# Patient Record
Sex: Female | Born: 1990 | Race: White | Hispanic: No | Marital: Single | Smoking: Never smoker
Health system: Southern US, Community
[De-identification: ages and names within clinical notes are randomized; demographics above are authoritative.]

## PROBLEM LIST (undated history)

## (undated) DIAGNOSIS — F410 Panic disorder [episodic paroxysmal anxiety] without agoraphobia: Secondary | ICD-10-CM

## (undated) DIAGNOSIS — F32A Depression, unspecified: Secondary | ICD-10-CM

## (undated) DIAGNOSIS — M8618 Other acute osteomyelitis, other site: Secondary | ICD-10-CM

## (undated) DIAGNOSIS — F329 Major depressive disorder, single episode, unspecified: Secondary | ICD-10-CM

## (undated) DIAGNOSIS — F419 Anxiety disorder, unspecified: Secondary | ICD-10-CM

## (undated) DIAGNOSIS — J90 Pleural effusion, not elsewhere classified: Secondary | ICD-10-CM

## (undated) HISTORY — DX: Major depressive disorder, single episode, unspecified: F32.9

## (undated) HISTORY — PX: OTHER SURGICAL HISTORY: SHX169

## (undated) HISTORY — DX: Depression, unspecified: F32.A

## (undated) HISTORY — PX: CHEST SURGERY: SHX595

## (undated) HISTORY — DX: Anxiety disorder, unspecified: F41.9

## (undated) HISTORY — DX: Other acute osteomyelitis, other site: M86.18

---
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
## 2010-02-28 ENCOUNTER — Encounter: Payer: Self-pay | Admitting: Infectious Diseases

## 2010-09-03 ENCOUNTER — Encounter: Payer: Self-pay | Admitting: Family Medicine

## 2010-09-12 NOTE — Miscellaneous (Signed)
Summary: [facility]  [facility]   Imported By: Florinda Marker 03/01/2010 11:20:37  _____________________________________________________________________  External Attachment:    Type:   Image     Comment:   External Document

## 2011-11-08 ENCOUNTER — Emergency Department: Payer: Self-pay | Admitting: Emergency Medicine

## 2011-11-08 LAB — COMPREHENSIVE METABOLIC PANEL
Anion Gap: 15 (ref 7–16)
BUN: 12 mg/dL (ref 7–18)
Bilirubin,Total: 0.4 mg/dL (ref 0.2–1.0)
Calcium, Total: 9 mg/dL (ref 8.5–10.1)
Co2: 21 mmol/L (ref 21–32)
Osmolality: 280 (ref 275–301)
Potassium: 3 mmol/L — ABNORMAL LOW (ref 3.5–5.1)
Sodium: 140 mmol/L (ref 136–145)

## 2011-11-08 LAB — DRUG SCREEN, URINE
Amphetamines, Ur Screen: NEGATIVE (ref ?–1000)
Cannabinoid 50 Ng, Ur ~~LOC~~: NEGATIVE (ref ?–50)
Cocaine Metabolite,Ur ~~LOC~~: NEGATIVE (ref ?–300)
MDMA (Ecstasy)Ur Screen: NEGATIVE (ref ?–500)
Phencyclidine (PCP) Ur S: NEGATIVE (ref ?–25)

## 2011-11-08 LAB — URINALYSIS, COMPLETE
Glucose,UR: NEGATIVE mg/dL (ref 0–75)
RBC,UR: 1 /HPF (ref 0–5)

## 2011-11-08 LAB — ETHANOL: Ethanol %: <0.003 % (ref 0.000–0.080)

## 2011-11-08 LAB — CBC
MCV: 87 fL (ref 80–100)
RDW: 13.9 % (ref 11.5–14.5)

## 2011-11-08 LAB — TSH: Thyroid Stimulating Horm: 3.11 u[IU]/mL

## 2011-11-08 LAB — ACETAMINOPHEN LEVEL: Acetaminophen: <2 ug/mL

## 2012-07-23 ENCOUNTER — Ambulatory Visit: Payer: Self-pay | Admitting: Cardiovascular Disease

## 2012-08-12 ENCOUNTER — Institutional Professional Consult (permissible substitution): Payer: Self-pay | Admitting: Cardiology

## 2012-12-29 ENCOUNTER — Encounter: Payer: Self-pay | Admitting: Gynecology

## 2012-12-29 ENCOUNTER — Ambulatory Visit (INDEPENDENT_AMBULATORY_CARE_PROVIDER_SITE_OTHER): Payer: Self-pay | Admitting: Gynecology

## 2012-12-29 DIAGNOSIS — Z124 Encounter for screening for malignant neoplasm of cervix: Secondary | ICD-10-CM

## 2012-12-29 DIAGNOSIS — N906 Unspecified hypertrophy of vulva: Secondary | ICD-10-CM

## 2012-12-29 DIAGNOSIS — N946 Dysmenorrhea, unspecified: Secondary | ICD-10-CM

## 2012-12-29 DIAGNOSIS — Z01419 Encounter for gynecological examination (general) (routine) without abnormal findings: Secondary | ICD-10-CM

## 2012-12-29 DIAGNOSIS — R5381 Other malaise: Secondary | ICD-10-CM

## 2012-12-29 MED ORDER — DROSPIRENONE-ETHINYL ESTRADIOL 3-0.02 MG PO TABS: 1.0000 | ORAL_TABLET | Freq: Every day | ORAL | Status: DC

## 2012-12-29 NOTE — Progress Notes (Signed)
22 y.o.   Single    Caucasian   Female G0  here for annual exam.  Pt currently on oc for management of dysmenorrhea.  Pt used condoms when sexually active. Last partner 52m ago.  Pt has been screened for STD in past.  Pt reports enlarged minora labia that are uncomfortable with coitus, exercise and itchy.  Pt reports using otc monistat even if no discharge. She is interested in having a labioplasty done. 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  Patient's last menstrual period was 12/12/2012.          Sexually active: no  The current method of family planning is OCP (estrogen/progesterone).    Exercising: pilates 1-2x/wk Last mammogram:  none Last pap smear: 14 months ago- Normal History of abnormal pap: had abnormal papsmear 3 1/2 years ago had repeat papsmear normal. No procedure done. Smoking: no Alcohol: 5 drinks/month Last colonoscopy: none Last Bone Density:  none Last tetanus shot: 2006 Last cholesterol check:   Hgb: declined ( due to insurance)   Urine: declined   No family history on file.  Patient Active Problem List   Diagnosis Date Noted  . OSTEOMYELITIS, ACUTE, OTHER [facility] SITE 10/24/2006    Past Medical History  Diagnosis Date  . Acute osteomyelitis, other specified site     No past surgical history on file.  Allergies: Review of patient's allergies indicates no known allergies.  No current outpatient prescriptions on file.   No current facility-administered medications for this visit.    ROS: Pertinent items are noted in HPI.  Social Hx:    Exam:    LMP 12/12/2012   Wt Readings from Last 3 Encounters:  10/24/06 130 lb 9.6 oz (59.24 kg) (63%*, Z = 0.33)   * Growth percentiles are based on CDC 2-20 Years data.     Ht Readings from Last 3 Encounters:  No data found for Ht    General appearance: alert, cooperative and appears stated age Head: Normocephalic, without obvious abnormality, atraumatic Neck: no adenopathy, supple, symmetrical,  trachea midline and thyroid not enlarged, symmetric, no tenderness/mass/nodules Lungs: clear to auscultation bilaterally Breasts: Inspection negative, No nipple retraction or dimpling, No nipple discharge or bleeding, No axillary or supraclavicular adenopathy, Normal to palpation without dominant masses Heart: regular rate and rhythm Abdomen: soft, non-tender; bowel sounds normal; no masses,  no organomegaly Extremities: extremities normal, atraumatic, no cyanosis or edema Skin: Skin color, texture, turgor normal. No rashes or lesions Lymph nodes: Cervical, supraclavicular, and axillary nodes normal. No abnormal inguinal nodes palpated Neurologic: Grossly normal   Pelvic: External genitalia:  Enlarged labia minora protruding beyond majora, fused half way to majora creating pouches bilaterally, upper portion extend approx 4cm beyond majora              Urethra:  normal appearing urethra with no masses, tenderness or lesions              Bartholins and Skenes: normal                 Vagina: normal appearing vagina with normal color and discharge, no lesions              Cervix: normal appearance              Pap taken: yes        Bimanual Exam:  Uterus:  uterus is normal size, shape, consistency and nontender  Adnexa: normal adnexa in size, nontender and no masses                                      Rectovaginal: Confirms                                      Anus:  normal sphincter tone, no lesions  A:  Normal well women exam Symptomatic labial hypertrophy      P: pap smear with reflex Refill oc, declines STD screening We reviewed her labial hypertrophy at length, minor extend significantly beyond majora and are adherent to major posteriorly creating pockets for smegma. 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

## 2012-12-29 NOTE — Progress Notes (Deleted)
22 y.o.   Single    {Race/ethnicity:17218}   female   No obstetric history on file.   here for annual exam.    Patient's last menstrual period was 12/12/2012.          Sexually active: {yes no:314532}  The current method of family planning is {contraception:315051}.    Exercising:  Last mammogram:   Last pap smear: History of abnormal pap:  Smoking: Alcohol: Last colonoscopy: Last Bone Density:   Last tetanus shot: Last cholesterol check:   Hgb:                Urine:   No family history on file.  Patient Active Problem List   Diagnosis Date Noted  . OSTEOMYELITIS, ACUTE, OTHER [facility] SITE 10/24/2006    Past Medical History  Diagnosis Date  . Acute osteomyelitis, other specified site     No past surgical history on file.  Allergies: Review of patient's allergies indicates no known allergies.  No current outpatient prescriptions on file.   No current facility-administered medications for this visit.    ROS: Pertinent items are noted in HPI.  Social Hx:    Exam:    LMP 12/12/2012   Wt Readings from Last 3 Encounters:  10/24/06 130 lb 9.6 oz (59.24 kg) (63%*, Z = 0.33)   * Growth percentiles are based on CDC 2-20 Years data.     Ht Readings from Last 3 Encounters:  No data found for Ht    General appearance: alert, cooperative and appears stated age Head: Normocephalic, without obvious abnormality, atraumatic Neck: no adenopathy, supple, symmetrical, trachea midline and thyroid not enlarged, symmetric, no tenderness/mass/nodules Lungs: clear to auscultation bilaterally Breasts: Inspection negative, No nipple retraction or dimpling, No nipple discharge or bleeding, No axillary or supraclavicular adenopathy, Normal to palpation without dominant masses Heart: regular rate and rhythm Abdomen: soft, non-tender; bowel sounds normal; no masses,  no organomegaly Extremities: extremities normal, atraumatic, no cyanosis or edema Skin: Skin color, texture, turgor  normal. No rashes or lesions Lymph nodes: Cervical, supraclavicular, and axillary nodes normal. No abnormal inguinal nodes palpated Neurologic: Grossly normal   Pelvic: External genitalia:  no lesions              Urethra:  normal appearing urethra with no masses, tenderness or lesions              Bartholins and Skenes: normal                 Vagina: normal appearing vagina with normal color and discharge, no lesions              Cervix: normal appearance              Pap taken: yes        Bimanual Exam:  Uterus:  uterus is normal size, shape, consistency and nontender                                      Adnexa: normal adnexa in size, nontender and no masses                                      Rectovaginal: Confirms  Anus:  normal sphincter tone, no lesions  A: normal exam     P: {plan; gyn:5269::"mammogram","pap smear","return annually or prn"}     An After Visit Summary was printed and given to the patient.

## 2012-12-31 ENCOUNTER — Ambulatory Visit (INDEPENDENT_AMBULATORY_CARE_PROVIDER_SITE_OTHER): Payer: Self-pay | Admitting: *Deleted

## 2012-12-31 DIAGNOSIS — Z0000 Encounter for general adult medical examination without abnormal findings: Secondary | ICD-10-CM

## 2012-12-31 DIAGNOSIS — R5381 Other malaise: Secondary | ICD-10-CM

## 2012-12-31 NOTE — Progress Notes (Signed)
Patient and mother called this am for lab appointment stating they had talked with Dr Farrel Gobble regarding Thyroid labwork. Patient was just seen for AEX and was self pay so she declined all labs.  Did not fully explain to Dr lathrop the extent of her fatigue, nausea and dizziness.  Mother is insistent that "something is wrong with her" and wants all testing done and she will pay cost for patient .  Mother states patient would sleep ell the time if mother didn't get her up.  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  Patient is fasting at 220 pm since 2300 last night and is sitting in lab chair with head down on arm of chair. They are concerned that with her history of osteomylitis that perhaps something like that is going on.  Discussed the fact that we are GYN practice and that we can not do labwork without an order and that Dr lathrop needs to be able to assess her in order to know what labs should be done.  The mother requests we dram blood and hold it for Dr lathrop  To review tomorrow because patient works in [place] and wont be able to get back here. They are advised that if labwork shows any abnormality, we would have to refer her out. They are agreeable. They are aware Dr Farrel Gobble is not in office and that we are making an exception for her by drawing blood and holding for order that MD may not be willing to order.

## 2013-01-01 ENCOUNTER — Other Ambulatory Visit: Payer: Self-pay | Admitting: Gynecology

## 2013-01-01 ENCOUNTER — Other Ambulatory Visit: Payer: Self-pay | Admitting: *Deleted

## 2013-01-01 DIAGNOSIS — R5381 Other malaise: Secondary | ICD-10-CM

## 2013-01-01 NOTE — Addendum Note (Signed)
Addended by: Douglass Rivers on: 01/01/2013 05:20 PM   Modules accepted: Orders

## 2013-01-02 DIAGNOSIS — R5383 Other fatigue: Secondary | ICD-10-CM

## 2013-01-02 DIAGNOSIS — R5381 Other malaise: Secondary | ICD-10-CM

## 2013-01-02 LAB — CBC
MCHC: 34.2 g/dL (ref 30.0–36.0)
MCV: 88 fL (ref 78.0–100.0)
Platelets: 262 10*3/uL (ref 150–400)
RBC: 4.59 MIL/uL (ref 3.87–5.11)
RDW: 13.9 % (ref 11.5–15.5)

## 2013-01-02 LAB — COMPREHENSIVE METABOLIC PANEL
Albumin: 3.6 g/dL (ref 3.5–5.2)
CO2: 25 mEq/L (ref 19–32)
Chloride: 104 mEq/L (ref 96–112)
Glucose, Bld: 89 mg/dL (ref 70–99)
Potassium: 4.6 mEq/L (ref 3.5–5.3)
Total Protein: 6.6 g/dL (ref 6.0–8.3)

## 2013-01-02 MED ORDER — FLUCONAZOLE 150 MG PO TABS: 150.0000 mg | ORAL_TABLET | Freq: Once | ORAL | Status: DC

## 2013-01-02 NOTE — Telephone Encounter (Signed)
Per Dr. Farrel Gobble, pt had signs of yeast on her papsmear ok to call in to pharmacy, if pt desires. Diflucan 150 mg #1/0 rf's sent through to [facility].  LMTCB  Re: Rx being called in and labs CBC/TSH/VIT D, Epstein Bar Virus, CMV (Cytomegalovirus) Dr. Farrel Gobble added to her bloodwork.

## 2013-01-06 ENCOUNTER — Telehealth: Payer: Self-pay | Admitting: Gynecology

## 2013-01-06 NOTE — Telephone Encounter (Signed)
Pt calling for results

## 2013-01-06 NOTE — Telephone Encounter (Signed)
Pt. Notified of results (see lab) 

## 2013-01-07 LAB — EPSTEIN-BARR VIRUS NUCLEAR ANTIGEN ANTIBODY, IGG: EBV NA IgG: 3.4 U/mL (ref <18.0)

## 2013-01-08 NOTE — Telephone Encounter (Signed)
Pt. Called to confirm surgery. Pt. Called insurance and found out that her insurance is active February 10, 2013 . Insurance will cover all but. 30%. Please call to schedule surgery/rsb

## 2013-01-12 NOTE — Telephone Encounter (Signed)
Needs to be scheduled for pre-op and surgery the patient. Is paying out of pocket.

## 2013-01-13 ENCOUNTER — Telehealth: Payer: Self-pay | Admitting: Gynecology

## 2013-01-13 NOTE — Telephone Encounter (Signed)
Patient stated that she has been trying to reach [facility] for the last couple of weeks in regards to scheduling an appointment for a procedure with no success.

## 2013-01-13 NOTE — Telephone Encounter (Signed)
Patient stated that she was returning Kayla Livingston's phone call to leave her insurance ID number. Patient did not want to give the ID # to me; she stated that she was told by Eber Jones to leave a message for her if she was not available.

## 2013-01-14 NOTE — Telephone Encounter (Signed)
RETURNING A CALL TO CAROLYN

## 2013-01-14 NOTE — Telephone Encounter (Signed)
Patient says she has been waiting on a call from Kennon Rounds and Eber Jones?

## 2013-01-15 NOTE — Telephone Encounter (Signed)
Surgery is scheduled for 02-17-13 @0930   at [facility]. Patient is notified of date and requests to call back for scheduling pre/post op appointments.

## 2013-01-19 NOTE — Telephone Encounter (Signed)
See next telephone note for scheduling info. 

## 2013-01-21 ENCOUNTER — Telehealth: Payer: Self-pay | Admitting: Gynecology

## 2013-01-21 NOTE — Telephone Encounter (Signed)
Pt calling to give sally insurance information and to get date of pre-op appt.

## 2013-01-22 NOTE — Telephone Encounter (Signed)
I will mark her in pre-op b/c I will be gone, I think 1 w post-op is ok, can add another f/u if needed

## 2013-01-22 NOTE — Telephone Encounter (Signed)
Pt is very upset that Kennon Rounds has not called her back yet. Pt states she has called nine times in the last couple of days. She also states she will call every hour until she talks to Adams. She did not say exactly what she was calling about.

## 2013-01-22 NOTE — Telephone Encounter (Signed)
See next phone note.  Surgery info given.

## 2013-01-22 NOTE — Telephone Encounter (Signed)
I will mark pt in pre-op, 1 week post op is ok, can add 2nd visit if need be

## 2013-01-22 NOTE — Telephone Encounter (Signed)
Call to patient to advise of surgery date and time. Apologizes for delay in return call, our insurance dept was attempting to determine discount for self pay patient's versus ability to file claim after insurance effective date.  Patient now states she has been added to parents plan and has coverage.  Insurance numbers received from patient and will have Carolynn call her with new estimate.  Surgical instructions given and mailed. Scheduled 1 week post op.  Patient says you told her she needed preop appt the day before surgery, you are out of office 02-16-13, when to schedule?  Should I schedule more than 1 post op appt?

## 2013-01-29 ENCOUNTER — Telehealth: Payer: Self-pay | Admitting: Gynecology

## 2013-01-29 NOTE — Telephone Encounter (Signed)
6/19 lmtcb//kn 

## 2013-01-29 NOTE — Telephone Encounter (Signed)
LMTCB to discuss setting a payment date for her surgery on 02/17/13.

## 2013-02-03 ENCOUNTER — Telehealth: Payer: Self-pay | Admitting: Infectious Diseases

## 2013-02-03 NOTE — Telephone Encounter (Signed)
Spoke with patient about her upcoming surgery and the need for payment. Patient agreed to stop by on Friday 6/27 to make the full payment. Patient understands that if we do not receive her payment by Friday her surgery will be canceled. Patient was agreeable. Scheduled pre-op appointment for 7/7 with Dr. Farrel Gobble.

## 2013-02-03 NOTE — Telephone Encounter (Signed)
Dr Lathrop's schedule confirmed and preop for patient scheduled for 02-16-13 and patient spoke to Carolynn to confirm appt and payment plan.

## 2013-02-09 ENCOUNTER — Encounter (HOSPITAL_COMMUNITY): Payer: Self-pay

## 2013-02-09 NOTE — Telephone Encounter (Signed)
[facility] admissions calling for insurance coverage for this patient? None in EPIC? Please advise.

## 2013-02-16 ENCOUNTER — Ambulatory Visit (INDEPENDENT_AMBULATORY_CARE_PROVIDER_SITE_OTHER): Payer: BC Managed Care – PPO | Admitting: Gynecology

## 2013-02-16 VITALS — BP 118/74 | HR 78 | Resp 14 | Ht 68.0 in | Wt 147.0 lb

## 2013-02-16 DIAGNOSIS — N906 Unspecified hypertrophy of vulva: Secondary | ICD-10-CM

## 2013-02-16 MED ORDER — LIDOCAINE HCL 2 % EX GEL: CUTANEOUS | Status: DC | PRN

## 2013-02-16 MED ORDER — ESTRADIOL 0.1 MG/GM VA CREA: 2.0000 g | TOPICAL_CREAM | Freq: Every day | VAGINAL | Status: DC

## 2013-02-16 NOTE — Progress Notes (Signed)
Left message with office to advise Dr. Farrel Gobble there are no pre-op orders for this patient.

## 2013-02-16 NOTE — Patient Instructions (Signed)
Sit on a donut after surgery. Ice pack for the first 24h-frozen peas Apply estrogen cream to the surgical site 2-3x/day Initially urinate in sitz bath, cool to luke warm water, prn Lidocaine jelly as needed for itching Blow dry after sitz bath and shower-cool setting, blot not wipe Colace twice a day, starting today

## 2013-02-16 NOTE — Progress Notes (Signed)
Subjective:     Patient ID: Kayla Livingston, female   DOB: Apr 23, 1991, 22 y.o.   MRN: [id number]  HPI Comments: Pt here for pre-op for labioplasty.  Pt reports that labia interfere with genital hygiene and make coitus painful, as well as interfere with activates of daily living.      Review of Systems  Constitutional: Negative.   Respiratory: Negative.   Genitourinary: Negative.        Objective:   Physical Exam  Constitutional: She is oriented to person, place, and time. She appears well-developed and well-nourished.  Eyes: Conjunctivae are normal. Pupils are equal, round, and reactive to light.  Neck: Normal range of motion. Neck supple.  Cardiovascular: Normal rate, regular rhythm, normal heart sounds and intact distal pulses.   Pulmonary/Chest: Effort normal and breath sounds normal.  Abdominal: Soft. Bowel sounds are normal.  Genitourinary:     Neurological: She is alert and oriented to person, place, and time. She has normal reflexes.       Assessment:    labial hypertrophy    Plan:     For reduction in am Questions addressed. Risks of bleeding, infection, DVT formation, PE. Discussed, samples of estrace given and instructed

## 2013-02-17 ENCOUNTER — Ambulatory Visit (HOSPITAL_COMMUNITY)
Admission: RE | Admit: 2013-02-17 | Discharge: 2013-02-17 | Disposition: A | Discharge status: Home / Self Care | Payer: BC Managed Care – PPO | Source: Ambulatory Visit | Attending: Gynecology | Admitting: Gynecology

## 2013-02-17 ENCOUNTER — Ambulatory Visit (HOSPITAL_COMMUNITY): Payer: BC Managed Care – PPO | Admitting: Anesthesiology

## 2013-02-17 ENCOUNTER — Encounter (HOSPITAL_COMMUNITY): Payer: Self-pay | Admitting: Anesthesiology

## 2013-02-17 ENCOUNTER — Encounter (HOSPITAL_COMMUNITY): Payer: Self-pay | Admitting: *Deleted

## 2013-02-17 ENCOUNTER — Encounter (HOSPITAL_COMMUNITY)
Admission: RE | Disposition: A | Discharge status: Home / Self Care | Payer: Self-pay | Source: Ambulatory Visit | Attending: Gynecology

## 2013-02-17 DIAGNOSIS — N906 Unspecified hypertrophy of vulva: Secondary | ICD-10-CM | POA: Insufficient documentation

## 2013-02-17 HISTORY — DX: Panic disorder (episodic paroxysmal anxiety): F41.0

## 2013-02-17 HISTORY — PX: LABIOPLASTY: SHX1900

## 2013-02-17 LAB — CBC
HCT: 44.5 % (ref 36.0–46.0)
Platelets: 201 10*3/uL (ref 150–400)
RBC: 4.34 MIL/uL (ref 3.87–5.11)
RDW: 12.8 % (ref 11.5–15.5)
WBC: 4.5 10*3/uL (ref 4.0–10.5)

## 2013-02-17 SURGERY — LABIAPLASTY, VULVA
Anesthesia: General | Wound class: Clean Contaminated

## 2013-02-17 MED ORDER — BUPIVACAINE HCL (PF) 0.25 % IJ SOLN
INTRAMUSCULAR | Status: AC
  Filled 2013-02-17: qty 30

## 2013-02-17 MED ORDER — FENTANYL CITRATE 0.05 MG/ML IJ SOLN: 25.0000 ug | INTRAMUSCULAR | Status: DC | PRN

## 2013-02-17 MED ORDER — KETOROLAC TROMETHAMINE 30 MG/ML IJ SOLN
INTRAMUSCULAR | Status: AC
  Filled 2013-02-17: qty 1

## 2013-02-17 MED ORDER — DEXAMETHASONE SODIUM PHOSPHATE 10 MG/ML IJ SOLN
INTRAMUSCULAR | Status: AC
  Filled 2013-02-17: qty 1

## 2013-02-17 MED ORDER — KETOROLAC TROMETHAMINE 30 MG/ML IJ SOLN: 15.0000 mg | Freq: Once | INTRAMUSCULAR | Status: DC | PRN

## 2013-02-17 MED ORDER — ONDANSETRON HCL 4 MG/2ML IJ SOLN
INTRAMUSCULAR | Status: AC
  Filled 2013-02-17: qty 2

## 2013-02-17 MED ORDER — ACETAMINOPHEN 160 MG/5ML PO SOLN: 975.0000 mg | Freq: Once | ORAL | Status: AC

## 2013-02-17 MED ORDER — ESTRADIOL 0.1 MG/GM VA CREA
TOPICAL_CREAM | VAGINAL | Status: DC | PRN
  Administered 2013-02-17: 1 via VAGINAL

## 2013-02-17 MED ORDER — LACTATED RINGERS IV SOLN
INTRAVENOUS | Status: DC
  Administered 2013-02-17 (×2): via INTRAVENOUS

## 2013-02-17 MED ORDER — LIDOCAINE HCL 2 % IJ SOLN
INTRAMUSCULAR | Status: DC | PRN
  Administered 2013-02-17: 10 mL

## 2013-02-17 MED ORDER — FENTANYL CITRATE 0.05 MG/ML IJ SOLN
INTRAMUSCULAR | Status: DC | PRN
  Administered 2013-02-17 (×3): 50 ug via INTRAVENOUS

## 2013-02-17 MED ORDER — LIDOCAINE HCL (CARDIAC) 20 MG/ML IV SOLN
INTRAVENOUS | Status: DC | PRN
  Administered 2013-02-17 (×2): 30 mg via INTRAVENOUS

## 2013-02-17 MED ORDER — LACTATED RINGERS IV SOLN
INTRAVENOUS | Status: DC
  Administered 2013-02-17: 10:00:00 via INTRAVENOUS

## 2013-02-17 MED ORDER — OXYCODONE-ACETAMINOPHEN 5-325 MG PO TABS
1.0000 | ORAL_TABLET | Freq: Once | ORAL | Status: AC
  Administered 2013-02-17: 1 via ORAL

## 2013-02-17 MED ORDER — MIDAZOLAM HCL 5 MG/5ML IJ SOLN
INTRAMUSCULAR | Status: DC | PRN
  Administered 2013-02-17: 2 mg via INTRAVENOUS

## 2013-02-17 MED ORDER — BUPIVACAINE HCL (PF) 0.25 % IJ SOLN
INTRAMUSCULAR | Status: DC | PRN
  Administered 2013-02-17: 10 mL

## 2013-02-17 MED ORDER — ESTRADIOL 0.1 MG/GM VA CREA
TOPICAL_CREAM | VAGINAL | Status: AC
  Filled 2013-02-17: qty 42.5

## 2013-02-17 MED ORDER — 0.9 % SODIUM CHLORIDE (POUR BTL) OPTIME
TOPICAL | Status: DC | PRN
  Administered 2013-02-17: 1000 mL

## 2013-02-17 MED ORDER — FENTANYL CITRATE 0.05 MG/ML IJ SOLN
INTRAMUSCULAR | Status: AC
  Filled 2013-02-17: qty 2

## 2013-02-17 MED ORDER — ACETAMINOPHEN 160 MG/5ML PO SOLN
ORAL | Status: AC
  Administered 2013-02-17: 975 mg via ORAL
  Filled 2013-02-17: qty 40.6

## 2013-02-17 MED ORDER — KETOROLAC TROMETHAMINE 30 MG/ML IJ SOLN
INTRAMUSCULAR | Status: DC | PRN
  Administered 2013-02-17 (×2): 30 mg via INTRAVENOUS

## 2013-02-17 MED ORDER — DEXAMETHASONE SODIUM PHOSPHATE 10 MG/ML IJ SOLN
INTRAMUSCULAR | Status: DC | PRN
  Administered 2013-02-17: 10 mg via INTRAVENOUS

## 2013-02-17 MED ORDER — ONDANSETRON HCL 4 MG/2ML IJ SOLN
INTRAMUSCULAR | Status: DC | PRN
  Administered 2013-02-17: 4 mg via INTRAVENOUS

## 2013-02-17 MED ORDER — PROPOFOL 10 MG/ML IV EMUL
INTRAVENOUS | Status: AC
  Filled 2013-02-17: qty 20

## 2013-02-17 MED ORDER — LIDOCAINE HCL (CARDIAC) 20 MG/ML IV SOLN
INTRAVENOUS | Status: AC
  Filled 2013-02-17: qty 5

## 2013-02-17 MED ORDER — LIDOCAINE HCL 2 % IJ SOLN
INTRAMUSCULAR | Status: AC
  Filled 2013-02-17: qty 20

## 2013-02-17 MED ORDER — PROPOFOL 10 MG/ML IV BOLUS
INTRAVENOUS | Status: DC | PRN
  Administered 2013-02-17: 160 mg via INTRAVENOUS
  Administered 2013-02-17: 40 mg via INTRAVENOUS

## 2013-02-17 MED ORDER — MIDAZOLAM HCL 2 MG/2ML IJ SOLN
INTRAMUSCULAR | Status: AC
  Filled 2013-02-17: qty 2

## 2013-02-17 MED ORDER — OXYCODONE-ACETAMINOPHEN 5-325 MG PO TABS
ORAL_TABLET | ORAL | Status: AC
  Filled 2013-02-17: qty 1

## 2013-02-17 SURGICAL SUPPLY — 23 items
BLADE SURG 15 STRL LF C SS BP (BLADE) ×1 IMPLANT
BLADE SURG 15 STRL SS (BLADE) ×2
CONTAINER PREFILL 10% NBF 15ML (MISCELLANEOUS) ×2 IMPLANT
ELECT NDL TIP 2.8 STRL (NEEDLE) IMPLANT
ELECT NEEDLE TIP 2.8 STRL (NEEDLE) ×2 IMPLANT
GAUZE SPONGE 4X4 16PLY XRAY LF (GAUZE/BANDAGES/DRESSINGS) ×1 IMPLANT
GLOVE BIOGEL M 6.5 STRL (GLOVE) ×2 IMPLANT
GLOVE SURGEON 6.5 (GLOVE) ×2 IMPLANT
GOWN STRL REIN XL XLG (GOWN DISPOSABLE) ×4 IMPLANT
GOWN W/COTTON TOWEL STD LRG (GOWNS) ×4 IMPLANT
NDL HYPO 25X1 1.5 SAFETY (NEEDLE) ×1 IMPLANT
NEEDLE HYPO 25X1 1.5 SAFETY (NEEDLE) ×2 IMPLANT
PACK VAGINAL MINOR WOMEN LF (CUSTOM PROCEDURE TRAY) ×2 IMPLANT
PAD OB MATERNITY 4.3X12.25 (PERSONAL CARE ITEMS) ×2 IMPLANT
PENCIL BUTTON HOLSTER BLD 10FT (ELECTRODE) ×1 IMPLANT
SALINE 1000ML FOR IRRIGATION (IV SOLUTION) ×2 IMPLANT
SUT CHROMIC 3 0 SH 27 (SUTURE) IMPLANT
SUT PDS AB 4-0 PS2 18 (SUTURE) ×4 IMPLANT
SUT VIC AB 3-0 SH 27 (SUTURE)
SUT VIC AB 3-0 SH 27X BRD (SUTURE) IMPLANT
SUT VICRYL RAPIDE 3 0 (SUTURE) IMPLANT
TOWEL COTTON PACK 6EA (MISCELLANEOUS) ×4 IMPLANT
WATER 1000ML FOR IRRIGATION (IV SOLUTION) ×2 IMPLANT

## 2013-02-17 NOTE — Transfer of Care (Signed)
Immediate Anesthesia Transfer of Care Note  Patient: Kayla Livingston  Procedure(s) Performed: Procedure(s): LABIAPLASTY (N/A)  Patient Location: PACU  Anesthesia Type:General  Level of Consciousness: awake, alert  and oriented  Airway & Oxygen Therapy: Patient Spontanous Breathing and Patient connected to nasal cannula oxygen  Post-op Assessment: Report given to PACU RN and Post -op Vital signs reviewed and stable  Post vital signs: Reviewed and stable  Complications: No apparent anesthesia complications

## 2013-02-17 NOTE — H&P (View-Only) (Signed)
Subjective:     Patient ID: Kayla Livingston, female   DOB: 06/27/1991, 22 y.o.   MRN: [id number]  HPI Comments: Pt here for pre-op for labioplasty.  Pt reports that labia interfere with genital hygiene and make coitus painful, as well as interfere with activates of daily living.      Review of Systems  Constitutional: Negative.   Respiratory: Negative.   Genitourinary: Negative.        Objective:   Physical Exam  Constitutional: She is oriented to person, place, and time. She appears well-developed and well-nourished.  Eyes: Conjunctivae are normal. Pupils are equal, round, and reactive to light.  Neck: Normal range of motion. Neck supple.  Cardiovascular: Normal rate, regular rhythm, normal heart sounds and intact distal pulses.   Pulmonary/Chest: Effort normal and breath sounds normal.  Abdominal: Soft. Bowel sounds are normal.  Genitourinary:     Neurological: She is alert and oriented to person, place, and time. She has normal reflexes.       Assessment:    labial hypertrophy    Plan:     For reduction in am Questions addressed. Risks of bleeding, infection, DVT formation, PE. Discussed, samples of estrace given and instructed       

## 2013-02-17 NOTE — Brief Op Note (Signed)
02/17/2013  11:29 AM  PATIENT:  Kayla Livingston  22 y.o. female  PRE-OPERATIVE DIAGNOSIS:  Labial Hypertrophy  POST-OPERATIVE DIAGNOSIS:  Labial Hypertrophy  PROCEDURE:  Procedure(s): LABIAPLASTY (N/A)  SURGEON:  Surgeon(s) and Role:    * Bennye Alm, MD - Primary  PHYSICIAN ASSISTANT:   ASSISTANTS: none   ANESTHESIA:   local and general  EBL:  Total I/O In: 1300 [I.V.:1300] Out: 50 [Urine:20; Blood:30]  BLOOD ADMINISTERED:none  DRAINS: none   LOCAL MEDICATIONS USED:  MARCAINE   , LIDOCAINE  and Amount: 10 ml  SPECIMEN:  No Specimen  DISPOSITION OF SPECIMEN:  N/A  COUNTS:  YES  TOURNIQUET:  * No tourniquets in log *  DICTATION: .Other Dictation: Dictation Number [id number]  PLAN OF CARE: Discharge to home after PACU  PATIENT DISPOSITION:  PACU - hemodynamically stable.   Delay start of Pharmacological VTE agent (>24hrs) due to surgical blood loss or risk of bleeding: not applicable

## 2013-02-17 NOTE — Anesthesia Preprocedure Evaluation (Signed)
Anesthesia Evaluation  Patient identified by MRN, date of birth, ID band Patient awake    Reviewed: Allergy & Precautions, H&P , NPO status , Patient's Chart, lab work & pertinent test results, reviewed documented beta blocker date and time   History of Anesthesia Complications Negative for: history of anesthetic complications  Airway Mallampati: I TM Distance: >3 FB Neck ROM: full    Dental  (+) Teeth Intact   Pulmonary neg pulmonary ROS,  breath sounds clear to auscultation  Pulmonary exam normal       Cardiovascular Exercise Tolerance: Good negative cardio ROS  Rhythm:regular Rate:Normal     Neuro/Psych PSYCHIATRIC DISORDERS (anxiety, depression, panic attacks - took xanax last night) negative neurological ROS     GI/Hepatic negative GI ROS, Neg liver ROS,   Endo/Other  negative endocrine ROS  Renal/GU negative Renal ROS  Female GU complaint     Musculoskeletal   Abdominal   Peds  Hematology negative hematology ROS (+)   Anesthesia Other Findings   Reproductive/Obstetrics negative OB ROS                           Anesthesia Physical Anesthesia Plan  ASA: II  Anesthesia Plan: General LMA   Post-op Pain Management:    Induction:   Airway Management Planned:   Additional Equipment:   Intra-op Plan:   Post-operative Plan:   Informed Consent: I have reviewed the patients History and Physical, chart, labs and discussed the procedure including the risks, benefits and alternatives for the proposed anesthesia with the patient or authorized representative who has indicated his/her understanding and acceptance.   Dental Advisory Given  Plan Discussed with: CRNA and Surgeon  Anesthesia Plan Comments:         Anesthesia Quick Evaluation

## 2013-02-17 NOTE — Interval H&P Note (Signed)
History and Physical Interval Note:  02/17/2013 9:15 AM  Kayla Livingston  has presented today for surgery, with the diagnosis of Labial Hypertrophy  The various methods of treatment have been discussed with the patient and family. After consideration of risks, benefits and other options for treatment, the patient has consented to  Procedure(s): LABIAPLASTY (N/A) as a surgical intervention .  The patient's history has been reviewed, patient examined, no change in status, stable for surgery.  I have reviewed the patient's chart and labs.  Questions were answered to the patient's satisfaction.     Tawona Filsinger H

## 2013-02-17 NOTE — Op Note (Signed)
NAMERON, JUNCO                 ACCOUNT NO.:  [id number]  MEDICAL RECORD NO.:  [id number]  LOCATION:  [facility]                          FACILITY:  [facility]  PHYSICIAN:  Ivor Costa. Farrel Gobble, M.D. DATE OF BIRTH:  1991-01-13  DATE OF PROCEDURE:  02/17/2013 DATE OF DISCHARGE:  02/17/2013                              OPERATIVE REPORT   PREOPERATIVE DIAGNOSIS:  Symptomatic labial hyperplasia.  POSTOPERATIVE DIAGNOSIS:  Symptomatic labial hyperplasia.  PROCEDURE:  Labioplasty.  SURGEON:  Ivor Costa. Farrel Gobble, M.D.  ANESTHESIA:  General with a local infusion at the end of the procedure with 2% lidocaine, 0.25% Marcaine, 5 mL total per labial.  IV FLUIDS:  1300 mL lactated Ringer's.  URINE OUTPUT:  Approximately 20.  ESTIMATED BLOOD LOSS:  30.  INDICATIONS:  The patient is a 22 year old with bilateral labial hyperplasia that is interfering with her activities of daily living.  In addition, the patient has 2 pockets of fusion of the labial minora to the majora which is creating a hygiene problem.  FINDINGS:  Labia minora symmetrically extending 3.5 cm beyond the majora.  The labial are fused to the majora at 4 and 8 o'clock creating 2 pockets.  The remainder is unremarkable.  PROCEDURE:  The patient was taken to the operating room, placed in the dorsal lithotomy position, prepped and draped in usual sterile fashion. The labia were grasped with an Allis clamp and pulled laterally at which point a 3.5 cm marking was made using this mark and pulling down from the clitoral hood down to the posterior fourchette.  A hemicircular line for incision was made and this was done bilaterally.  The labia was then removed off traction in order the line noted to fall within the majora. Labia was then regrasped on the right hand side and sharply transected off.  There was an area with the labia remained fused on the lower aspect and this was sharply dissected off.  Several interrupted sutures were placed for  both hemostasis and reapproximation of the tissue with 4- 0 PDS.  There was a area of buttoning noted on the right from the fusion area and this was sharply dissected off and again this area of tissue was plicated.  Attention was then turned to the left and similarly the Allis clamp were used to elevate the tissue and held on tension and it was sharply dissected off.  This labia was similarly plicated with 4-0 PDS.  There was no residual pocket that was appreciated on the side. The labial were then inspected to the right labia with some additional revisions to make it more symmetrical with the left which appeared cosmetically appropriate.  The area was then replicated as needed until the labia appeared both hemostatic, symmetrical, and appropriately like within the majora as the patient had desired.  The gentle pressure was then placed on the suture line.  Each labia majora at the base was injected with 5 mL going up the incisional line of 2% lidocaine, 0.25% Marcaine for later pain control.  Estrace cream was placed on the suture line.  The 4 sponges were then sewn together and placed into the hymen in order to put minimal pressure  on the incision line.  Similarly that sponge was treated with Estrace cream.  The patient tolerated the procedure well.  Sponge and needle counts were correct x2.  She was transferred to the recovery room in stable condition.     Ivor Costa. Farrel Gobble, M.D.     THL/MEDQ  D:  02/17/2013  T:  02/17/2013  Job:  [id number]

## 2013-02-18 ENCOUNTER — Encounter (HOSPITAL_COMMUNITY): Payer: Self-pay | Admitting: Gynecology

## 2013-02-18 NOTE — Anesthesia Postprocedure Evaluation (Signed)
  Anesthesia Post-op Note  Anesthesia Post Note  Patient: Kayla Livingston  Procedure(s) Performed: Procedure(s) (LRB): LABIAPLASTY (N/A)  Anesthesia type: General  Patient location: PACU  Post pain: Pain level controlled  Post assessment: Post-op Vital signs reviewed  Last Vitals:  Filed Vitals:   02/17/13 1156  BP:   Pulse: 81  Temp: 36.5 C  Resp: 15    Post vital signs: Reviewed  Level of consciousness: sedated  Complications: No apparent anesthesia complications

## 2013-02-20 ENCOUNTER — Telehealth: Payer: Self-pay | Admitting: Gynecology

## 2013-02-20 NOTE — Telephone Encounter (Signed)
Checking up after surgery, left message

## 2013-02-23 ENCOUNTER — Ambulatory Visit (INDEPENDENT_AMBULATORY_CARE_PROVIDER_SITE_OTHER): Payer: BC Managed Care – PPO | Admitting: Gynecology

## 2013-02-23 VITALS — BP 110/78 | HR 78 | Resp 18 | Ht 68.0 in | Wt 147.0 lb

## 2013-02-23 DIAGNOSIS — Z9889 Other specified postprocedural states: Secondary | ICD-10-CM

## 2013-02-23 NOTE — Progress Notes (Signed)
Subjective:     Kayla Livingston is a 22 y.o. female who presents to the clinic 1 weeks status post labioplasty for labial hypertrophy. Eating a regular diet without difficulty. Bowel movements are normal. The patient is not having any pain.  The following portions of the patient's history were reviewed and updated as appropriate: allergies, current medications, past family history, past medical history, past social history, past surgical history and problem list.  Review of Systems Pertinent items are noted in HPI.    Objective:    BP 110/78  Pulse 78  Resp 18  Ht 5\' 8"  (1.727 m)  Wt 147 lb (66.679 kg)  BMI 22.36 kg/m2  LMP 02/06/2013 General:  alert, cooperative and appears stated age  Abdomen:  not examined  Incision:   healing well, no swelling, well approximated, incision well approximated     sutures removed from left minora, most from right, some swelling on right greater than left noted Assessment:    Doing well postoperatively. Operative findings again reviewed. Pathology report discussed.    Plan:    1. Continue any current medications. 2. Wound care discussed. 3. Activity restrictions: no lifting more than 15 pounds 4. Anticipated return to work: now. 5. Follow up: 1 week for suture removal.

## 2013-02-24 ENCOUNTER — Ambulatory Visit: Payer: BC Managed Care – PPO | Admitting: Gynecology

## 2013-03-02 ENCOUNTER — Encounter: Payer: Self-pay | Admitting: Gynecology

## 2013-03-02 ENCOUNTER — Ambulatory Visit (INDEPENDENT_AMBULATORY_CARE_PROVIDER_SITE_OTHER): Payer: BC Managed Care – PPO | Admitting: Gynecology

## 2013-03-02 VITALS — BP 90/62 | HR 72 | Ht 68.0 in | Wt 151.0 lb

## 2013-03-02 DIAGNOSIS — N906 Unspecified hypertrophy of vulva: Secondary | ICD-10-CM

## 2013-03-02 DIAGNOSIS — Z9889 Other specified postprocedural states: Secondary | ICD-10-CM

## 2013-03-02 NOTE — Progress Notes (Signed)
Subjective:     Patient ID: Kayla Livingston, female   DOB: [birth year], 22 y.o.   MRN: [id number]  HPI Comments: Pt here for follow up overall is feeling better no longer having any itching since we removed the surtures last visit, a few were unable to be removed.  She is pleased with the cosmetic effects    Review of Systems  All other systems reviewed and are negative.       Objective:   Physical Exam  Constitutional: She is oriented to person, place, and time. She appears well-developed and well-nourished.  Genitourinary:    No labial fusion. There is no tenderness, lesion or injury on the right labia. There is no tenderness, lesion or injury on the left labia.  Neurological: She is alert and oriented to person, place, and time.  labia minora within majora with legs partially split     Assessment:     Post op Labial hypertrophy     Plan:     Doing well, may resume usual activities, recommend using lubricants such as EVOO or cocoanut oil for first few coital encounters Can stop estrogen cream

## 2014-03-18 ENCOUNTER — Other Ambulatory Visit: Payer: Self-pay | Admitting: Gynecology

## 2014-03-18 NOTE — Telephone Encounter (Signed)
Last AEX: 12/29/12 Last refill:12/29/12 #1 pack, 12 ref Current AEX:Not scheduled  Pt needs an appt before any refills

## 2014-03-29 ENCOUNTER — Other Ambulatory Visit: Payer: Self-pay | Admitting: *Deleted

## 2014-03-29 DIAGNOSIS — N946 Dysmenorrhea, unspecified: Secondary | ICD-10-CM

## 2014-03-29 NOTE — Telephone Encounter (Signed)
Fax From: [facility] for Loryna 3 MG-0.02 MG Last Refilled/ AEX 12/29/12 #1/12 refills  Aex Scheduled: No current AEX scheduled.  Tried to call patient and try to schedule AEX she has  A voicemail that has not been set up yet.   Denied through our system, patient needs to schedule AEX before any further refills.   Routed to provider for review, encounter closed.

## 2014-03-30 ENCOUNTER — Encounter: Payer: Self-pay | Admitting: Gynecology

## 2014-03-30 ENCOUNTER — Ambulatory Visit (INDEPENDENT_AMBULATORY_CARE_PROVIDER_SITE_OTHER): Payer: BC Managed Care – PPO | Admitting: Gynecology

## 2014-03-30 VITALS — BP 96/60 | HR 80 | Resp 14 | Ht 68.25 in | Wt 158.0 lb

## 2014-03-30 DIAGNOSIS — L708 Other acne: Secondary | ICD-10-CM

## 2014-03-30 DIAGNOSIS — Z01419 Encounter for gynecological examination (general) (routine) without abnormal findings: Secondary | ICD-10-CM

## 2014-03-30 DIAGNOSIS — R635 Abnormal weight gain: Secondary | ICD-10-CM

## 2014-03-30 DIAGNOSIS — Z124 Encounter for screening for malignant neoplasm of cervix: Secondary | ICD-10-CM

## 2014-03-30 DIAGNOSIS — N946 Dysmenorrhea, unspecified: Secondary | ICD-10-CM

## 2014-03-30 DIAGNOSIS — L709 Acne, unspecified: Secondary | ICD-10-CM

## 2014-03-30 DIAGNOSIS — Z0000 Encounter for general adult medical examination without abnormal findings: Secondary | ICD-10-CM

## 2014-03-30 LAB — POCT URINALYSIS DIPSTICK
Leukocytes, UA: NEGATIVE
PH UA: 5
Urobilinogen, UA: NEGATIVE

## 2014-03-30 LAB — HEMOGLOBIN, FINGERSTICK: Hemoglobin, fingerstick: 12.4 g/dL (ref 12.0–16.0)

## 2014-03-30 MED ORDER — CLINDAMYCIN-TRETINOIN 1.2-0.025 % EX GEL: 1.0000 "application " | Freq: Every day | CUTANEOUS | Status: DC

## 2014-03-30 MED ORDER — DROSPIRENONE-ETHINYL ESTRADIOL 3-0.02 MG PO TABS: 1.0000 | ORAL_TABLET | Freq: Every day | ORAL | Status: DC

## 2014-03-30 MED ORDER — CLINDAMYCIN PHOS-BENZOYL PEROX 1.2-5 % EX GEL: 1.0000 "application " | CUTANEOUS | Status: DC

## 2014-03-30 NOTE — Progress Notes (Signed)
23 y.o. Single Caucasian female   G0P0 here for annual exam. Pt is not  currently sexually active.  She reports not using condoms on a regular basis.  First sexual activity at 23 years old,  2 number of lifetime partners.  Pt stopped her ocp because busy with school but reports worse cramps and irregular cylces.  Currently not sexually active.  Pt reports issues with weight gain, gained 11# since last visit despite working out 4.-5x/w elliptical and pilates.  Patient's last menstrual period was 03/01/2014.          Sexually active: Yes The current method of family planning is none.    Exercising: Yes.    ellipitcal, pilates 5x/wk Last pap:  Alcohol: 2 drinks/month occasionally  Tobacco: no Drugs: no Gardisil: no, completed: Age 23   Hgb: 12.4 ; Urine: Negative  Health Maintenance  Topic Date Due  . Tetanus/tdap  12/30/2007  . Influenza Vaccine  03/13/2014  . Pap Smear  12/30/2015    Family History  Problem Relation Age of Onset  . Thyroid disease Mother   . Hypertension Father     Patient Active Problem List   Diagnosis Date Noted  . OSTEOMYELITIS, ACUTE, OTHER [facility] SITE 10/24/2006    Past Medical History  Diagnosis Date  . Acute osteomyelitis, other specified site 2008  . Anxiety   . Depression   . Panic attacks     Past Surgical History  Procedure Laterality Date  . Wisdom tooth extraction  2008  . Other surgical history  15 years ago    Broken Arm  . Left arm surgery      has screws  . Labioplasty N/A 02/17/2013    Procedure: LABIAPLASTY;  Surgeon: Bennye Alm, MD;  Location: [facility];  Service: Gynecology;  Laterality: N/A;    Allergies: Effexor  Current Outpatient Prescriptions  Medication Sig Dispense Refill  . amphetamine-dextroamphetamine (ADDERALL) 30 MG tablet Take by mouth daily.      Marland Kitchen ampicillin (PRINCIPEN) 500 MG capsule Take 500 mg by mouth 2 (two) times daily.       . Clindamycin-Benzoyl Per, Refr, gel Apply 1 application topically once a  week. Doses weekly when acne breaks out      . clindamycin-tretinoin (ZIANA) gel Apply 1 application topically at bedtime.      . clonazePAM (KLONOPIN) 0.5 MG tablet Take 0.5 mg by mouth 2 (two) times daily as needed for anxiety.      Marland Kitchen FLUoxetine (PROZAC) 20 MG capsule Take 20 mg by mouth daily. 3 pills at once      . drospirenone-ethinyl estradiol (YAZ,GIANVI,LORYNA) 3-0.02 MG tablet Take 1 tablet by mouth daily.  1 Package  12  . estradiol (ESTRACE VAGINAL) 0.1 MG/GM vaginal cream Place 0.25 Applicatorfuls vaginally daily.  42.5 g  12  . lidocaine (XYLOCAINE JELLY) 2 % jelly Apply topically as needed.  30 mL  0   No current facility-administered medications for this visit.    ROS: Pertinent items are noted in HPI.  Exam:    BP 96/60  Pulse 80  Resp 14  Ht 5' 8.25" (1.734 m)  Wt 158 lb (71.668 kg)  BMI 23.84 kg/m2  LMP 03/01/2014 Weight change: @WEIGHTCHANGE @ Last 3 height recordings:  Ht Readings from Last 3 Encounters:  03/30/14 5' 8.25" (1.734 m)  03/02/13 5\' 8"  (1.727 m)  02/23/13 5\' 8"  (1.727 m)   General appearance: alert, cooperative and appears stated age Head: Normocephalic, without obvious abnormality, atraumatic Neck: no  adenopathy, no carotid bruit, no JVD, supple, symmetrical, trachea midline and thyroid not enlarged, symmetric, no tenderness/mass/nodules Lungs: clear to auscultation bilaterally Breasts: normal appearance, no masses or tenderness Heart: regular rate and rhythm, S1, S2 normal, no murmur, click, rub or gallop Abdomen: soft, non-tender; bowel sounds normal; no masses,  no organomegaly Extremities: extremities normal, atraumatic, no cyanosis or edema Skin: Skin color, texture, turgor normal. No rashes or lesions Lymph nodes: Cervical, supraclavicular, and axillary nodes normal. no inguinal nodes palpated Neurologic: Grossly normal   Pelvic: External genitalia:  no lesions              Urethra: normal appearing urethra with no masses, tenderness or  lesions              Bartholins and Skenes: Bartholin's, Urethra, Skene's normal                 Vagina: normal appearing vagina with normal color and discharge, no lesions              Cervix: normal appearance              Pap taken: Yes.          Bimanual Exam:  Uterus:  uterus is normal size, shape, consistency and nontender                                      Adnexa:    normal adnexa in size, nontender and no masses                                      Rectovaginal: Confirms                                      Anus:  normal sphincter tone, no lesions     1. Routine gynecological examination counseled on breast self exam, use and side effects of OCP's, adequate intake of calcium and vitamin D, diet and exercise, sunscreen return annually or prn Discussed STD prevention, regular condom use.   2. Laboratory examination ordered as part of a routine general medical examination  - Hemoglobin, fingerstick - POCT Urinalysis Dipstick  3. Dysmenorrhea Condoms if sexually active - drospirenone-ethinyl estradiol (YAZ,GIANVI,LORYNA) 3-0.02 MG tablet; Take 1 tablet by mouth daily.  Dispense: 3 Package; Refill: 3  4. Screening for cervical cancer Guidelines reviewed - PAP with Reflex to HPV (IPS)  5. Weight gain Informed tsh normal last year, suspect change in diet and activity due to school - TSH  6. Acne, unspecified acne type Refill as pt is leaving for school in 2d but will need to f/u with dermatology  - clindamycin-tretinoin [place](ZIANA) gel; Apply 1 application topically at bedtime.  Dispense: 30 g; Refill: 0 - Clindamycin-Benzoyl Per, Refr, gel; Apply 1 application topically once a week. Doses weekly when acne breaks out  Dispense: 45 g; Refill: 0   An After Visit Summary was printed and given to the patient.

## 2014-03-31 LAB — TSH: TSH: 1.098 u[IU]/mL (ref 0.350–4.500)

## 2014-03-31 LAB — IPS PAP TEST WITH REFLEX TO HPV

## 2014-04-08 ENCOUNTER — Telehealth: Payer: Self-pay | Admitting: Gynecology

## 2014-04-08 NOTE — Telephone Encounter (Signed)
Checking status of Prior auth for vagial gel

## 2014-04-08 NOTE — Telephone Encounter (Signed)
Prior authorization to Dr.Lathrop's desk for review and signature before faxing.

## 2014-04-09 ENCOUNTER — Other Ambulatory Visit: Payer: Self-pay | Admitting: Gynecology

## 2014-04-09 NOTE — Telephone Encounter (Signed)
PA faxed today to [phone] [facility] [place] for Ziana acne medication.

## 2014-04-12 NOTE — Telephone Encounter (Signed)
03/30/14 #3 packs with 3 [facility] [place] [place] Tried to call patient to let her know that she can have refills transferred but patient does not have vm set up will deny this request and patient can contact our office if refills are needed.

## 2014-04-13 NOTE — Telephone Encounter (Signed)
Spoke with [place] at [employer] who states that PA for Kayla Livingston was approved. Attempted to reach patient at number provided [phone] to advise of approval. There was no answer and the voicemail box has not yet been set up yet. Will try to reach patient again later.

## 2014-04-14 NOTE — Telephone Encounter (Signed)
Spoke with patient advised of approval for medication. Patient is agreeable and verbalizes understanding.  Routing to provider for final review. Patient agreeable to disposition. Will close encounter

## 2014-07-20 ENCOUNTER — Other Ambulatory Visit: Payer: Self-pay

## 2014-07-20 DIAGNOSIS — N946 Dysmenorrhea, unspecified: Secondary | ICD-10-CM

## 2014-07-20 NOTE — Telephone Encounter (Signed)
Medication refill request: Loryna Last AEX:  03/30/14 Next AEX: NS Last MMG (if hormonal medication request): NA Refill authorized: 9  Pt was given new rx on 03/30/14. Pt will call it refills are needed. Encounter closed.

## 2015-03-07 ENCOUNTER — Other Ambulatory Visit: Payer: Self-pay | Admitting: *Deleted

## 2015-03-07 DIAGNOSIS — N946 Dysmenorrhea, unspecified: Secondary | ICD-10-CM

## 2015-03-07 MED ORDER — DROSPIRENONE-ETHINYL ESTRADIOL 3-0.02 MG PO TABS: 1.0000 | ORAL_TABLET | Freq: Every day | ORAL | Status: DC

## 2015-03-07 NOTE — Telephone Encounter (Signed)
Pt needs AEX.  Please call pt.  RF for three month without RFs done.  Thanks.

## 2015-03-07 NOTE — Telephone Encounter (Signed)
Medication refill request: Loryna Last AEX:  03/30/14 with TL  Next AEX: No AEX scheduled Last MMG (if hormonal medication request): n/a Refill authorized: #3 packs  Left Message To Call Back

## 2015-03-08 ENCOUNTER — Other Ambulatory Visit: Payer: Self-pay

## 2015-03-09 NOTE — Telephone Encounter (Signed)
Left Message To Call Back  

## 2016-08-13 DIAGNOSIS — J90 Pleural effusion, not elsewhere classified: Secondary | ICD-10-CM

## 2016-08-13 HISTORY — DX: Pleural effusion, not elsewhere classified: J90

## 2017-05-21 ENCOUNTER — Encounter (HOSPITAL_COMMUNITY): Payer: Self-pay | Admitting: Emergency Medicine

## 2017-05-21 ENCOUNTER — Inpatient Hospital Stay (HOSPITAL_COMMUNITY)
Admission: EM | Admit: 2017-05-21 | Discharge: 2017-05-24 | DRG: 188 | Disposition: A | Discharge status: Home / Self Care | Payer: No Typology Code available for payment source | Attending: Family Medicine | Admitting: Family Medicine

## 2017-05-21 ENCOUNTER — Emergency Department (HOSPITAL_COMMUNITY): Payer: No Typology Code available for payment source

## 2017-05-21 DIAGNOSIS — F329 Major depressive disorder, single episode, unspecified: Secondary | ICD-10-CM | POA: Diagnosis present

## 2017-05-21 DIAGNOSIS — Z9889 Other specified postprocedural states: Secondary | ICD-10-CM

## 2017-05-21 DIAGNOSIS — R0602 Shortness of breath: Secondary | ICD-10-CM

## 2017-05-21 DIAGNOSIS — Z8249 Family history of ischemic heart disease and other diseases of the circulatory system: Secondary | ICD-10-CM

## 2017-05-21 DIAGNOSIS — T368X5A Adverse effect of other systemic antibiotics, initial encounter: Secondary | ICD-10-CM | POA: Diagnosis not present

## 2017-05-21 DIAGNOSIS — K5903 Drug induced constipation: Secondary | ICD-10-CM | POA: Diagnosis present

## 2017-05-21 DIAGNOSIS — Z8349 Family history of other endocrine, nutritional and metabolic diseases: Secondary | ICD-10-CM

## 2017-05-21 DIAGNOSIS — T40605A Adverse effect of unspecified narcotics, initial encounter: Secondary | ICD-10-CM | POA: Diagnosis present

## 2017-05-21 DIAGNOSIS — F419 Anxiety disorder, unspecified: Secondary | ICD-10-CM | POA: Diagnosis present

## 2017-05-21 DIAGNOSIS — J90 Pleural effusion, not elsewhere classified: Secondary | ICD-10-CM | POA: Diagnosis not present

## 2017-05-21 DIAGNOSIS — R21 Rash and other nonspecific skin eruption: Secondary | ICD-10-CM | POA: Diagnosis not present

## 2017-05-21 DIAGNOSIS — D649 Anemia, unspecified: Secondary | ICD-10-CM | POA: Diagnosis present

## 2017-05-21 DIAGNOSIS — Z888 Allergy status to other drugs, medicaments and biological substances status: Secondary | ICD-10-CM

## 2017-05-21 HISTORY — DX: Pleural effusion, not elsewhere classified: J90

## 2017-05-21 LAB — I-STAT BETA HCG BLOOD, ED (MC, WL, AP ONLY): I-stat hCG, quantitative: <5 m[IU]/mL (ref <5)

## 2017-05-21 LAB — COMPREHENSIVE METABOLIC PANEL
ALT: 36 U/L (ref 14–54)
AST: 25 U/L (ref 15–41)
Albumin: 3.2 g/dL — ABNORMAL LOW (ref 3.5–5.0)
Alkaline Phosphatase: 63 U/L (ref 38–126)
Anion gap: 11 (ref 5–15)
BUN: 13 mg/dL (ref 6–20)
CALCIUM: 9 mg/dL (ref 8.9–10.3)
CO2: 23 mmol/L (ref 22–32)
CREATININE: 0.81 mg/dL (ref 0.44–1.00)
Chloride: 103 mmol/L (ref 101–111)
GFR calc Af Amer: >60 mL/min (ref >60)
Glucose, Bld: 93 mg/dL (ref 65–99)
Potassium: 4.5 mmol/L (ref 3.5–5.1)
Sodium: 137 mmol/L (ref 135–145)
TOTAL PROTEIN: 6.6 g/dL (ref 6.5–8.1)

## 2017-05-21 LAB — URINALYSIS, ROUTINE W REFLEX MICROSCOPIC
BILIRUBIN URINE: NEGATIVE
Glucose, UA: NEGATIVE mg/dL
HGB URINE DIPSTICK: NEGATIVE
Ketones, ur: NEGATIVE mg/dL
Leukocytes, UA: NEGATIVE
Nitrite: NEGATIVE
PH: 5 (ref 5.0–8.0)
Protein, ur: NEGATIVE mg/dL
SPECIFIC GRAVITY, URINE: 1.015 (ref 1.005–1.030)

## 2017-05-21 LAB — CBC WITH DIFFERENTIAL/PLATELET
BASOS ABS: 0.1 10*3/uL (ref 0.0–0.1)
Basophils Relative: 1 %
Eosinophils Absolute: 0.4 10*3/uL (ref 0.0–0.7)
Eosinophils Relative: 6 %
HEMATOCRIT: 35.8 % — AB (ref 36.0–46.0)
Hemoglobin: 11.3 g/dL — ABNORMAL LOW (ref 12.0–15.0)
LYMPHS ABS: 1.7 10*3/uL (ref 0.7–4.0)
LYMPHS PCT: 24 %
MCH: 28 pg (ref 26.0–34.0)
MCHC: 31.6 g/dL (ref 30.0–36.0)
MCV: 88.6 fL (ref 78.0–100.0)
MONO ABS: 0.6 10*3/uL (ref 0.1–1.0)
Monocytes Relative: 8 %
NEUTROS ABS: 4.4 10*3/uL (ref 1.7–7.7)
Neutrophils Relative %: 61 %
Platelets: 479 10*3/uL — ABNORMAL HIGH (ref 150–400)
RBC: 4.04 MIL/uL (ref 3.87–5.11)
RDW: 13.5 % (ref 11.5–15.5)
WBC: 7.1 10*3/uL (ref 4.0–10.5)

## 2017-05-21 LAB — I-STAT TROPONIN, ED: Troponin i, poc: 0 ng/mL (ref 0.00–0.08)

## 2017-05-21 LAB — I-STAT CHEM 8, ED
BUN: 15 mg/dL (ref 6–20)
CREATININE: 0.7 mg/dL (ref 0.44–1.00)
Calcium, Ion: 1.11 mmol/L — ABNORMAL LOW (ref 1.15–1.40)
Chloride: 104 mmol/L (ref 101–111)
Glucose, Bld: 88 mg/dL (ref 65–99)
HEMATOCRIT: 33 % — AB (ref 36.0–46.0)
Hemoglobin: 11.2 g/dL — ABNORMAL LOW (ref 12.0–15.0)
Potassium: 4.5 mmol/L (ref 3.5–5.1)
Sodium: 138 mmol/L (ref 135–145)
TCO2: 26 mmol/L (ref 22–32)

## 2017-05-21 LAB — I-STAT CG4 LACTIC ACID, ED: LACTIC ACID, VENOUS: 1.14 mmol/L (ref 0.5–1.9)

## 2017-05-21 MED ORDER — ONDANSETRON HCL 40 MG/20ML IJ SOLN
8.0000 mg | Freq: Once | INTRAMUSCULAR | Status: AC
  Administered 2017-05-21: 8 mg via INTRAVENOUS
  Filled 2017-05-21: qty 4

## 2017-05-21 MED ORDER — SULFAMETHOXAZOLE-TRIMETHOPRIM 400-80 MG/5ML IV SOLN
4.0000 mg/kg | Freq: Two times a day (BID) | INTRAVENOUS | Status: DC
  Administered 2017-05-22 (×2): 299.2 mg via INTRAVENOUS
  Filled 2017-05-21 (×2): qty 18.7

## 2017-05-21 MED ORDER — IOPAMIDOL (ISOVUE-370) INJECTION 76%
INTRAVENOUS | Status: AC
  Administered 2017-05-21: 100 mL
  Filled 2017-05-21: qty 100

## 2017-05-21 MED ORDER — SODIUM CHLORIDE 0.9 % IV SOLN
8.0000 mg | Freq: Once | INTRAVENOUS | Status: AC
  Administered 2017-05-22: 8 mg via INTRAVENOUS
  Filled 2017-05-21 (×2): qty 4

## 2017-05-21 MED ORDER — MORPHINE SULFATE (PF) 2 MG/ML IV SOLN
2.0000 mg | INTRAVENOUS | Status: DC | PRN
  Administered 2017-05-22: 2 mg via INTRAVENOUS
  Filled 2017-05-21 (×3): qty 1

## 2017-05-21 MED ORDER — MORPHINE SULFATE (PF) 4 MG/ML IV SOLN
6.0000 mg | Freq: Once | INTRAVENOUS | Status: AC
  Administered 2017-05-21: 6 mg via INTRAVENOUS
  Filled 2017-05-21: qty 2

## 2017-05-21 NOTE — Progress Notes (Signed)
Pharmacy Antibiotic Note  Kayla Livingston is a 26 y.o. female admitted on 05/21/2017 with wound infection s/p recent surgery.  Pharmacy has been consulted for bactrim dosing. Pt is afebrile and WBC is WNL. SCr is WNL at 0.81.   Plan: Bactrim /kg trimethoprim IV BID F/u renal fxn, C&S, clinical status  F/u ability to change to PO  Height:  (172.7 cm) Weight: 165 lb (74.8 kg) IBW/kg (Calculated) : 63.9  Temp (24hrs), Avg:98.6 F (37 C), Min:98.6 F (37 C), Max:98.6 F (37 C)   Recent Labs Lab 05/21/17 1620 05/21/17 1635 05/21/17 1715  WBC 7.1  --   --   CREATININE 0.81  --  0.70  LATICACIDVEN  --  1.14  --     Estimated Creatinine Clearance: 105.6 mL/min (by C-G formula based on SCr of 0.7 mg/dL).    Allergies  Allergen Reactions  . 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 Chlorhexidine Gluconate Hives, Itching, Dermatitis and Rash    Antimicrobials this admission: Bactrim 10/9>>  Dose adjustments this admission: N/A  Microbiology results: Pending  Thank you for allowing pharmacy to be a part of this patient's care.  Takiera Mayo, Drake Leach 05/21/2017 10:34 PM

## 2017-05-21 NOTE — ED Provider Notes (Signed)
Emergency Department Provider Note   I have reviewed the triage vital signs and the nursing notes.   HISTORY  Chief Complaint Chest Pain; Shortness of Breath; and Wound Infection   HPI Kayla Livingston is a 26 y.o. female who had a pes excavatum surgery on September 20 with her chest tubes removed a couple days afterwards.and discharged shortly after that. She went home and had been doing well until a few days ago she had worsening shortness of breath, chest pain and back pain. She also started having some purulent drainage from the wound in her right chest. She talked to her cardiothoracic surgeon in [place] told her to come here for further evaluation. She states she saw her primary doctor yesterday who did her chest x-ray showing worsening pleural effusions. Has had some intermittent fevers but nothing persistently. No other symptoms.   Past Medical History:  Diagnosis Date  . Acute osteomyelitis, other specified site 2008  . Anxiety   . Depression   . Panic attacks     Patient Active Problem List   Diagnosis Date Noted  . Pleural effusion 05/22/2017  . Bilateral pleural effusion 05/21/2017  . OSTEOMYELITIS, ACUTE, OTHER [facility] SITE 10/24/2006    Past Surgical History:  Procedure Laterality Date  . CHEST SURGERY    . LABIOPLASTY N/A 02/17/2013   Procedure: LABIAPLASTY;  Surgeon: Bennye Alm, MD;  Location: [facility];  Service: Gynecology;  Laterality: N/A;  . left arm surgery     has screws  . OTHER SURGICAL HISTORY  15 years ago   Broken Arm  . WISDOM TOOTH EXTRACTION  2008      Allergies Effexor [venlafaxine] and Chlorhexidine gluconate  Family History  Problem Relation Age of Onset  . Thyroid disease Mother   . Hypertension Father     Social History Social History  Substance Use Topics  . Smoking status: Never Smoker  . Smokeless tobacco: Never Used  . Alcohol use Yes     Comment: socially    Review of Systems  All other systems negative except as  documented in the HPI. All pertinent positives and negatives as reviewed in the HPI. ____________________________________________   PHYSICAL EXAM:  VITAL SIGNS: ED Triage Vitals  Enc Vitals Group     BP 05/21/17 1609 107/70     Pulse Rate 05/21/17 1609 89     Resp 05/21/17 1609 20     Temp 05/21/17 1609 98.6 F (37 C)     Temp Source 05/21/17 1609 Oral     SpO2 05/21/17 1609 98 %     Weight 05/21/17 1610 165 lb (74.8 kg)     Height 05/21/17 1610  (1.727 m)    Constitutional: Alert and oriented. Well appearing and in no acute distress. Eyes: Conjunctivae are normal. PERRL. EOMI. Head: Atraumatic. Nose: No congestion/rhinnorhea. Mouth/Throat: Mucous membranes are moist.  Oropharynx non-erythematous. Neck: No stridor.  No meningeal signs.   Cardiovascular: Normal rate, regular rhythm. Good peripheral circulation. Grossly normal heart sounds.   Respiratory: Normal respiratory effort.  No retractions. Lungs diminished bilaterally with some rales. Gastrointestinal: Soft and nontender. No distention.  Musculoskeletal: No lower extremity tenderness nor edema. No gross deformities of extremities. Neurologic:  Normal speech and language. No gross focal neurologic deficits are appreciated.  Skin:  Skin is warm, dry and intact. No rash noted. Right chest with 1 cm wound without drainage, erythema or warmth currently. ____________________________________________   LABS (all labs ordered are listed, but only abnormal  results are displayed)  Labs Reviewed  CBC WITH DIFFERENTIAL/PLATELET - Abnormal; Notable for the following:       Result Value   Hemoglobin 11.3 (*)    HCT 35.8 (*)    Platelets 479 (*)    All other components within normal limits  COMPREHENSIVE METABOLIC PANEL - Abnormal; Notable for the following:    Albumin 3.2 (*)    Total Bilirubin <0.1 (*)    All other components within normal limits  I-STAT CHEM 8, ED - Abnormal; Notable for the following:    Calcium, Ion  1.11 (*)    Hemoglobin 11.2 (*)    HCT 33.0 (*)    All other components within normal limits  CULTURE, BLOOD (ROUTINE X 2)  CULTURE, BLOOD (ROUTINE X 2)  MRSA PCR SCREENING  URINALYSIS, ROUTINE W REFLEX MICROSCOPIC  HIV ANTIBODY (ROUTINE TESTING)  BASIC METABOLIC PANEL  CBC  I-STAT CG4 LACTIC ACID, ED  I-STAT TROPONIN, ED  I-STAT BETA HCG BLOOD, ED ([facility], [facility], [facility] ONLY)   ____________________________________________  EKG   EKG Interpretation  Date/Time:  Tuesday May 21 2017 16:00:32 EDT Ventricular Rate:  93 PR Interval:  94 QRS Duration: 72 QT Interval:  356 QTC Calculation: 442 R Axis:   80 Text Interpretation:  Normal sinus rhythm No acute changes Confirmed by Marily Memos [phone]) on 05/22/2017 2:31:41 AM       ____________________________________________  RADIOLOGY  Dg Chest 2 View  Result Date: 05/21/2017 CLINICAL DATA:  Chest pain and shortness of breath, fever, foul-smelling drainage from RIGHT-side of chest, thoracic surgery on 05/02/2017 with placement of titanium rods at anterior chest EXAM: CHEST  2 VIEW COMPARISON:  None FINDINGS: Metallic foreign bodies traverse the anterior chest. Normal heart size, mediastinal contours, and pulmonary vascularity. Bibasilar effusions and atelectasis greater on LEFT. Remaining lungs clear. No pleural effusion or pneumothorax. Bones unremarkable. IMPRESSION: Bibasilar atelectasis and pleural effusions LEFT greater than RIGHT. Electronically Signed   By: Ulyses Southward M.D.   On: 05/21/2017 17:36   Ct Angio Chest Pe W And/or Wo Contrast  Result Date: 05/21/2017 CLINICAL DATA:  Chest pain, abdomen pain and fever. EXAM: CT ANGIOGRAPHY CHEST CT ABDOMEN AND PELVIS WITH CONTRAST TECHNIQUE: Multidetector CT imaging of the chest was performed using the standard protocol during bolus administration of intravenous contrast. Multiplanar CT image reconstructions and MIPs were obtained to evaluate the vascular anatomy. Multidetector CT imaging  of the abdomen and pelvis was performed using the standard protocol during bolus administration of intravenous contrast. CONTRAST:  100 mL Isovue 370 intravenous COMPARISON:  Radiograph 05/21/2017 FINDINGS: CTA CHEST FINDINGS Cardiovascular: Satisfactory opacification of the pulmonary arteries to the segmental level. No evidence of pulmonary embolism. Normal heart size. No pericardial effusion. Mediastinum/Nodes: No enlarged mediastinal, hilar, or axillary lymph nodes. Thyroid gland, trachea, and esophagus demonstrate no significant findings. Lungs/Pleura: Large pleural effusions bilaterally. Adjacent atelectatic-appearing consolidation in the posterior lung bases. Airways are patent and normal in caliber. Musculoskeletal: Prior chest wall surgery with two plates anterior to the lower ribs and posterior to the sternum and xiphoid. No acute bony abnormality. Review of the MIP images confirms the above findings. CT ABDOMEN and PELVIS FINDINGS Hepatobiliary: No focal liver abnormality is seen. No gallstones, gallbladder wall thickening, or biliary dilatation. Pancreas: Unremarkable. No pancreatic ductal dilatation or surrounding inflammatory changes. Spleen: Normal in size without focal abnormality. Adrenals/Urinary Tract: Adrenal glands are unremarkable. Kidneys are normal, without renal calculi, focal lesion, or hydronephrosis. Bladder is unremarkable. Stomach/Bowel: Stomach is within normal  limits. Appendix appears normal. Large volume stool throughout the colon, without obstruction or inflammation. No evidence of bowel wall thickening, distention, or inflammatory changes. Vascular/Lymphatic: No significant vascular findings are present. No enlarged abdominal or pelvic lymph nodes. Reproductive: Uterus and bilateral adnexa are unremarkable. Other: No acute inflammation.  No ascites. Musculoskeletal: No significant skeletal lesion. Review of the MIP images confirms the above findings. IMPRESSION: 1. Negative for  acute pulmonary embolism. 2. Large pleural effusions bilaterally with adjacent atelectatic appearing posterior lung base opacities. 3. Large volume colonic stool without evidence of bowel obstruction, perforation or inflammation. Electronically Signed   By: Ellery Plunk M.D.   On: 05/21/2017 19:14   Ct Abdomen Pelvis W Contrast  Result Date: 05/21/2017 CLINICAL DATA:  Chest pain, abdomen pain and fever. EXAM: CT ANGIOGRAPHY CHEST CT ABDOMEN AND PELVIS WITH CONTRAST TECHNIQUE: Multidetector CT imaging of the chest was performed using the standard protocol during bolus administration of intravenous contrast. Multiplanar CT image reconstructions and MIPs were obtained to evaluate the vascular anatomy. Multidetector CT imaging of the abdomen and pelvis was performed using the standard protocol during bolus administration of intravenous contrast. CONTRAST:  100 mL Isovue 370 intravenous COMPARISON:  Radiograph 05/21/2017 FINDINGS: CTA CHEST FINDINGS Cardiovascular: Satisfactory opacification of the pulmonary arteries to the segmental level. No evidence of pulmonary embolism. Normal heart size. No pericardial effusion. Mediastinum/Nodes: No enlarged mediastinal, hilar, or axillary lymph nodes. Thyroid gland, trachea, and esophagus demonstrate no significant findings. Lungs/Pleura: Large pleural effusions bilaterally. Adjacent atelectatic-appearing consolidation in the posterior lung bases. Airways are patent and normal in caliber. Musculoskeletal: Prior chest wall surgery with two plates anterior to the lower ribs and posterior to the sternum and xiphoid. No acute bony abnormality. Review of the MIP images confirms the above findings. CT ABDOMEN and PELVIS FINDINGS Hepatobiliary: No focal liver abnormality is seen. No gallstones, gallbladder wall thickening, or biliary dilatation. Pancreas: Unremarkable. No pancreatic ductal dilatation or surrounding inflammatory changes. Spleen: Normal in size without focal  abnormality. Adrenals/Urinary Tract: Adrenal glands are unremarkable. Kidneys are normal, without renal calculi, focal lesion, or hydronephrosis. Bladder is unremarkable. Stomach/Bowel: Stomach is within normal limits. Appendix appears normal. Large volume stool throughout the colon, without obstruction or inflammation. No evidence of bowel wall thickening, distention, or inflammatory changes. Vascular/Lymphatic: No significant vascular findings are present. No enlarged abdominal or pelvic lymph nodes. Reproductive: Uterus and bilateral adnexa are unremarkable. Other: No acute inflammation.  No ascites. Musculoskeletal: No significant skeletal lesion. Review of the MIP images confirms the above findings. IMPRESSION: 1. Negative for acute pulmonary embolism. 2. Large pleural effusions bilaterally with adjacent atelectatic appearing posterior lung base opacities. 3. Large volume colonic stool without evidence of bowel obstruction, perforation or inflammation. Electronically Signed   By: Ellery Plunk M.D.   On: 05/21/2017 19:14    ____________________________________________   PROCEDURES  Procedure(s) performed:   Procedures   ____________________________________________   INITIAL IMPRESSION / ASSESSMENT AND PLAN / ED COURSE  Pertinent labs & imaging results that were available during my care of the patient were reviewed by me and considered in my medical decision making (see chart for details).  Recurrent bilateral plerual effusions. Discussed with Dr. Laneta Simmers with [facility] who recommended IR thoracentesis for drainage. Her CT surgeon from [place] suggested same and starting IV Bactrim for the drainage. Discussed with medicine who will admit.   ____________________________________________  FINAL CLINICAL IMPRESSION(S) / ED DIAGNOSES  Final diagnoses:  Chronic bilateral pleural effusions  Shortness of breath     MEDICATIONS  GIVEN DURING THIS VISIT:  Medications    sulfamethoxazole-trimethoprim (BACTRIM) 299.2 mg of trimethoprim in dextrose 5 % 250 mL IVPB (0 mg of trimethoprim Intravenous Stopped 05/22/17 0217)  morphine 2 MG/ML injection 2 mg (2 mg Intravenous Given 05/22/17 0021)  diazepam (VALIUM) tablet 5 mg (5 mg Oral Given 05/22/17 0130)  gabapentin (NEURONTIN) capsule 300 mg (300 mg Oral Given 05/22/17 0120)  ibuprofen (ADVIL,MOTRIN) tablet 800 mg (800 mg Oral Given 05/22/17 0120)  linaclotide (LINZESS) capsule 290 mcg (not administered)  mirabegron ER (MYRBETRIQ) tablet 25 mg (not administered)  pantoprazole (PROTONIX) EC tablet 40 mg (not administered)  oxyCODONE (Oxy IR/ROXICODONE) immediate release tablet 5 mg (not administered)  senna-docusate (Senokot-S) tablet 2 tablet (2 tablets Oral Given 05/22/17 0120)  traZODone (DESYREL) tablet 50 mg (not administered)  vortioxetine HBr (TRINTELLIX) 20 MG tablet 20 mg (20 mg Oral Given 05/22/17 0101)  ampicillin (PRINCIPEN) capsule 500 mg (500 mg Oral Given 05/22/17 0120)  acetaminophen (TYLENOL) tablet 1,000 mg (1,000 mg Oral Given 05/22/17 0120)  ondansetron (ZOFRAN) tablet 4 mg (not administered)    Or  ondansetron (ZOFRAN) injection 4 mg (not administered)  polyethylene glycol (MIRALAX / GLYCOLAX) packet 17 g (17 g Oral Given 05/22/17 0121)  lactulose (CHRONULAC) 10 GM/15ML solution 30 g (not administered)  naloxegol oxalate (MOVANTIK) tablet 25 mg (not administered)  hydrOXYzine (ATARAX/VISTARIL) tablet 50-100 mg (100 mg Oral Given 05/22/17 0120)  morphine 4 MG/ML injection 6 mg (6 mg Intravenous Given 05/21/17 1732)  ondansetron (ZOFRAN) 8 mg in sodium chloride 0.9 % 50 mL IVPB (0 mg Intravenous Stopped 05/21/17 1910)  iopamidol (ISOVUE-370) 76 % injection (100 mLs  Contrast Given 05/21/17 1807)  ondansetron (ZOFRAN) 8 mg in sodium chloride 0.9 % 50 mL IVPB (0 mg Intravenous Stopped 05/22/17 0102)     NEW OUTPATIENT MEDICATIONS STARTED DURING THIS VISIT:  Current Discharge Medication List       Note:  This document was prepared using Dragon voice recognition software and may include unintentional dictation errors.   Bryam Taborda, Barbara Cower, MD 05/22/17 [phone]

## 2017-05-21 NOTE — ED Notes (Signed)
MD Mesner at the bedside  

## 2017-05-21 NOTE — ED Notes (Signed)
Patient transported to X-ray 

## 2017-05-21 NOTE — ED Triage Notes (Addendum)
Pt st's she had thoracic surg on 9/20 at the [facility] in [place].  For past 3 days pt has been having chest pain, shortness of breath, elevated temp and foul smelling drainage coming from right side of chest.  Pt also st's she has had an approx 30lb weight gain since surg.

## 2017-05-22 ENCOUNTER — Observation Stay (HOSPITAL_COMMUNITY): Payer: No Typology Code available for payment source

## 2017-05-22 ENCOUNTER — Encounter (HOSPITAL_COMMUNITY): Payer: Self-pay | Admitting: Internal Medicine

## 2017-05-22 DIAGNOSIS — Z8349 Family history of other endocrine, nutritional and metabolic diseases: Secondary | ICD-10-CM | POA: Diagnosis not present

## 2017-05-22 DIAGNOSIS — Z8249 Family history of ischemic heart disease and other diseases of the circulatory system: Secondary | ICD-10-CM | POA: Diagnosis not present

## 2017-05-22 DIAGNOSIS — T40605A Adverse effect of unspecified narcotics, initial encounter: Secondary | ICD-10-CM | POA: Diagnosis present

## 2017-05-22 DIAGNOSIS — J90 Pleural effusion, not elsewhere classified: Principal | ICD-10-CM

## 2017-05-22 DIAGNOSIS — T368X5A Adverse effect of other systemic antibiotics, initial encounter: Secondary | ICD-10-CM | POA: Diagnosis not present

## 2017-05-22 DIAGNOSIS — F329 Major depressive disorder, single episode, unspecified: Secondary | ICD-10-CM | POA: Diagnosis present

## 2017-05-22 DIAGNOSIS — Z888 Allergy status to other drugs, medicaments and biological substances status: Secondary | ICD-10-CM | POA: Diagnosis not present

## 2017-05-22 DIAGNOSIS — R21 Rash and other nonspecific skin eruption: Secondary | ICD-10-CM | POA: Diagnosis not present

## 2017-05-22 DIAGNOSIS — K5903 Drug induced constipation: Secondary | ICD-10-CM | POA: Diagnosis present

## 2017-05-22 DIAGNOSIS — R0602 Shortness of breath: Secondary | ICD-10-CM | POA: Diagnosis present

## 2017-05-22 DIAGNOSIS — F419 Anxiety disorder, unspecified: Secondary | ICD-10-CM | POA: Diagnosis present

## 2017-05-22 DIAGNOSIS — D649 Anemia, unspecified: Secondary | ICD-10-CM | POA: Diagnosis present

## 2017-05-22 HISTORY — PX: IR THORACENTESIS ASP PLEURAL SPACE W/IMG GUIDE: IMG5380

## 2017-05-22 LAB — BASIC METABOLIC PANEL
ANION GAP: 8 (ref 5–15)
BUN: 10 mg/dL (ref 6–20)
CALCIUM: 8.2 mg/dL — AB (ref 8.9–10.3)
CO2: 27 mmol/L (ref 22–32)
Chloride: 102 mmol/L (ref 101–111)
Creatinine, Ser: 0.83 mg/dL (ref 0.44–1.00)
GFR calc Af Amer: >60 mL/min (ref >60)
GLUCOSE: 101 mg/dL — AB (ref 65–99)
Potassium: 3.5 mmol/L (ref 3.5–5.1)
Sodium: 137 mmol/L (ref 135–145)

## 2017-05-22 LAB — BODY FLUID CELL COUNT WITH DIFFERENTIAL
Eos, Fluid: 5 %
Lymphs, Fluid: 48 %
Monocyte-Macrophage-Serous Fluid: 27 % — ABNORMAL LOW (ref 50–90)
Neutrophil Count, Fluid: 20 % (ref 0–25)
WBC FLUID: 3615 uL — AB (ref 0–1000)

## 2017-05-22 LAB — CBC
HCT: 33.2 % — ABNORMAL LOW (ref 36.0–46.0)
HEMOGLOBIN: 10.5 g/dL — AB (ref 12.0–15.0)
MCH: 27.8 pg (ref 26.0–34.0)
MCHC: 31.6 g/dL (ref 30.0–36.0)
MCV: 87.8 fL (ref 78.0–100.0)
Platelets: 449 10*3/uL — ABNORMAL HIGH (ref 150–400)
RBC: 3.78 MIL/uL — ABNORMAL LOW (ref 3.87–5.11)
RDW: 13.6 % (ref 11.5–15.5)
WBC: 6.9 10*3/uL (ref 4.0–10.5)

## 2017-05-22 LAB — LACTATE DEHYDROGENASE, PLEURAL OR PERITONEAL FLUID: LD FL: 283 U/L — AB (ref 3–23)

## 2017-05-22 LAB — GRAM STAIN

## 2017-05-22 LAB — GLUCOSE, PLEURAL OR PERITONEAL FLUID: Glucose, Fluid: 89 mg/dL

## 2017-05-22 LAB — PROTEIN, PLEURAL OR PERITONEAL FLUID: Total protein, fluid: 3.8 g/dL

## 2017-05-22 LAB — HIV ANTIBODY (ROUTINE TESTING W REFLEX): HIV SCREEN 4TH GENERATION: NONREACTIVE

## 2017-05-22 LAB — MRSA PCR SCREENING: MRSA by PCR: NEGATIVE

## 2017-05-22 LAB — ALBUMIN, PLEURAL OR PERITONEAL FLUID: Albumin, Fluid: 2.2 g/dL

## 2017-05-22 MED ORDER — VORTIOXETINE HBR 20 MG PO TABS
20.0000 mg | ORAL_TABLET | Freq: Every day | ORAL | Status: DC
  Administered 2017-05-22 – 2017-05-23 (×3): 20 mg via ORAL
  Filled 2017-05-22 (×3): qty 20

## 2017-05-22 MED ORDER — OXYCODONE HCL 5 MG PO TABS
5.0000 mg | ORAL_TABLET | Freq: Four times a day (QID) | ORAL | Status: DC | PRN
  Administered 2017-05-22: 5 mg via ORAL
  Filled 2017-05-22 (×2): qty 1

## 2017-05-22 MED ORDER — NALOXEGOL OXALATE 25 MG PO TABS
25.0000 mg | ORAL_TABLET | Freq: Once | ORAL | Status: AC
  Administered 2017-05-22: 25 mg via ORAL
  Filled 2017-05-22: qty 1

## 2017-05-22 MED ORDER — MORPHINE SULFATE (PF) 4 MG/ML IV SOLN
INTRAVENOUS | Status: AC
  Administered 2017-05-22: 4 mg via INTRAVENOUS
  Filled 2017-05-22: qty 1

## 2017-05-22 MED ORDER — TRIAMCINOLONE ACETONIDE 0.5 % EX CREA
TOPICAL_CREAM | Freq: Two times a day (BID) | CUTANEOUS | Status: DC | PRN
  Filled 2017-05-22 (×2): qty 15

## 2017-05-22 MED ORDER — POLYETHYLENE GLYCOL 3350 17 G PO PACK
17.0000 g | PACK | Freq: Two times a day (BID) | ORAL | Status: DC
  Administered 2017-05-22 – 2017-05-24 (×6): 17 g via ORAL
  Filled 2017-05-22 (×6): qty 1

## 2017-05-22 MED ORDER — HYDROXYZINE PAMOATE 50 MG PO CAPS
50.0000 mg | ORAL_CAPSULE | Freq: Every day | ORAL | Status: DC
Start: 2017-05-22 — End: 2017-05-22
  Filled 2017-05-22: qty 2

## 2017-05-22 MED ORDER — SODIUM CHLORIDE 0.9 % IV BOLUS (SEPSIS)
500.0000 mL | Freq: Once | INTRAVENOUS | Status: AC
  Administered 2017-05-22: 500 mL via INTRAVENOUS

## 2017-05-22 MED ORDER — PANTOPRAZOLE SODIUM 40 MG PO TBEC
40.0000 mg | DELAYED_RELEASE_TABLET | Freq: Every day | ORAL | Status: DC
  Administered 2017-05-22 – 2017-05-24 (×3): 40 mg via ORAL
  Filled 2017-05-22 (×3): qty 1

## 2017-05-22 MED ORDER — ONDANSETRON HCL 4 MG PO TABS
4.0000 mg | ORAL_TABLET | Freq: Four times a day (QID) | ORAL | Status: DC | PRN
  Administered 2017-05-23: 4 mg via ORAL
  Filled 2017-05-22: qty 1

## 2017-05-22 MED ORDER — BISACODYL 10 MG RE SUPP: 10.0000 mg | Freq: Every day | RECTAL | Status: DC | PRN

## 2017-05-22 MED ORDER — ONDANSETRON 4 MG PO TBDP: 8.0000 mg | ORAL_TABLET | Freq: Every day | ORAL | Status: DC

## 2017-05-22 MED ORDER — IBUPROFEN 800 MG PO TABS
800.0000 mg | ORAL_TABLET | Freq: Three times a day (TID) | ORAL | Status: DC
  Administered 2017-05-22 – 2017-05-24 (×8): 800 mg via ORAL
  Filled 2017-05-22 (×8): qty 1

## 2017-05-22 MED ORDER — ACETAMINOPHEN 325 MG PO TABS: 650.0000 mg | ORAL_TABLET | Freq: Four times a day (QID) | ORAL | Status: DC | PRN

## 2017-05-22 MED ORDER — ACETAMINOPHEN 650 MG RE SUPP: 650.0000 mg | Freq: Four times a day (QID) | RECTAL | Status: DC | PRN

## 2017-05-22 MED ORDER — MIRABEGRON ER 25 MG PO TB24
25.0000 mg | ORAL_TABLET | Freq: Every day | ORAL | Status: DC
  Administered 2017-05-22 – 2017-05-24 (×3): 25 mg via ORAL
  Filled 2017-05-22 (×3): qty 1

## 2017-05-22 MED ORDER — MORPHINE SULFATE (PF) 4 MG/ML IV SOLN
4.0000 mg | INTRAVENOUS | Status: DC | PRN
  Administered 2017-05-22 – 2017-05-23 (×2): 4 mg via INTRAVENOUS
  Filled 2017-05-22 (×2): qty 1

## 2017-05-22 MED ORDER — LIDOCAINE 2% (20 MG/ML) 5 ML SYRINGE
INTRAMUSCULAR | Status: AC
  Filled 2017-05-22: qty 10

## 2017-05-22 MED ORDER — GABAPENTIN 300 MG PO CAPS
300.0000 mg | ORAL_CAPSULE | Freq: Three times a day (TID) | ORAL | Status: DC
  Administered 2017-05-22 – 2017-05-24 (×8): 300 mg via ORAL
  Filled 2017-05-22 (×8): qty 1

## 2017-05-22 MED ORDER — LACTULOSE 10 GM/15ML PO SOLN
30.0000 g | Freq: Every day | ORAL | Status: DC
  Administered 2017-05-22 – 2017-05-23 (×2): 30 g via ORAL
  Filled 2017-05-22 (×2): qty 45

## 2017-05-22 MED ORDER — SENNOSIDES-DOCUSATE SODIUM 8.6-50 MG PO TABS
2.0000 | ORAL_TABLET | Freq: Two times a day (BID) | ORAL | Status: DC
  Administered 2017-05-22 – 2017-05-24 (×6): 2 via ORAL
  Filled 2017-05-22 (×6): qty 2

## 2017-05-22 MED ORDER — VANCOMYCIN HCL IN DEXTROSE 750-5 MG/150ML-% IV SOLN
750.0000 mg | Freq: Three times a day (TID) | INTRAVENOUS | Status: DC
  Administered 2017-05-22 – 2017-05-23 (×3): 750 mg via INTRAVENOUS
  Filled 2017-05-22 (×4): qty 150

## 2017-05-22 MED ORDER — ONDANSETRON HCL 4 MG/2ML IJ SOLN
4.0000 mg | Freq: Four times a day (QID) | INTRAMUSCULAR | Status: DC | PRN
  Administered 2017-05-22 – 2017-05-23 (×2): 4 mg via INTRAVENOUS
  Filled 2017-05-22 (×3): qty 2

## 2017-05-22 MED ORDER — PROMETHAZINE HCL 25 MG/ML IJ SOLN
12.5000 mg | Freq: Once | INTRAMUSCULAR | Status: AC
  Administered 2017-05-22: 12.5 mg via INTRAVENOUS
  Filled 2017-05-22: qty 1

## 2017-05-22 MED ORDER — HYDROXYZINE HCL 50 MG PO TABS
50.0000 mg | ORAL_TABLET | Freq: Every day | ORAL | Status: DC
  Administered 2017-05-22 – 2017-05-23 (×3): 100 mg via ORAL
  Filled 2017-05-22 (×3): qty 2

## 2017-05-22 MED ORDER — TRAZODONE HCL 50 MG PO TABS: 50.0000 mg | ORAL_TABLET | Freq: Every evening | ORAL | Status: DC | PRN

## 2017-05-22 MED ORDER — DIAZEPAM 5 MG PO TABS
5.0000 mg | ORAL_TABLET | Freq: Three times a day (TID) | ORAL | Status: DC | PRN
  Administered 2017-05-22 – 2017-05-24 (×5): 5 mg via ORAL
  Filled 2017-05-22 (×5): qty 1

## 2017-05-22 MED ORDER — AMPICILLIN 500 MG PO CAPS
500.0000 mg | ORAL_CAPSULE | Freq: Two times a day (BID) | ORAL | Status: DC
  Administered 2017-05-22 – 2017-05-24 (×6): 500 mg via ORAL
  Filled 2017-05-22 (×6): qty 1

## 2017-05-22 MED ORDER — LINACLOTIDE 145 MCG PO CAPS
290.0000 ug | ORAL_CAPSULE | Freq: Every day | ORAL | Status: DC
  Administered 2017-05-22 – 2017-05-23 (×2): 290 ug via ORAL
  Filled 2017-05-22 (×2): qty 2

## 2017-05-22 MED ORDER — OXYCODONE HCL 5 MG PO TABS
5.0000 mg | ORAL_TABLET | Freq: Four times a day (QID) | ORAL | Status: DC | PRN
  Administered 2017-05-22: 10 mg via ORAL
  Administered 2017-05-23 – 2017-05-24 (×3): 5 mg via ORAL
  Filled 2017-05-22: qty 1
  Filled 2017-05-22: qty 2
  Filled 2017-05-22 (×2): qty 1

## 2017-05-22 MED ORDER — ACETAMINOPHEN 500 MG PO TABS
1000.0000 mg | ORAL_TABLET | Freq: Four times a day (QID) | ORAL | Status: DC
  Administered 2017-05-22 – 2017-05-24 (×7): 1000 mg via ORAL
  Filled 2017-05-22 (×7): qty 2

## 2017-05-22 NOTE — H&P (Signed)
History and Physical    [name]:[id number] DOB: 1991-02-07 DOA: 05/21/2017  PCP: System, Pcp Not In  Patient coming from: Home.  Chief Complaint: Shortness of breath.  HPI: Kayla Livingston is a 26 y.o. female with history of pectus excavatum who had recently undergone surgery at [facility] in [place] on September 20 presents to the ER with complaints of shortness of breath and generalized pain. Patient states over the last 3 days patient has been noticing increasing shortness of breath and peripheral edema. Denies any fever chills nausea vomiting or diarrhea. Patient has been having increasing constipation. Patient also has noticed some drainage from the chest tube sites. Patient had called her cardiothoracic surgeon and was prescribed Bactrim yesterday which patient is a take. Due to worsening shortness of breath patient came to the ER.   ED Course: In the ER patient had CT of the chest and abdomen and pelvis which shows significant bilateral pleural effusion and abdomen shows constipation. ER physician had discussed with Dr. Laneta Simmers on-call cardiothoracic surgeon, who has advised thoracentesis.  Review of Systems: As per HPI, rest all negative.   Past Medical History:  Diagnosis Date  . Acute osteomyelitis, other specified site 2008  . Anxiety   . Depression   . Panic attacks     Past Surgical History:  Procedure Laterality Date  . CHEST SURGERY    . LABIOPLASTY N/A 02/17/2013   Procedure: LABIAPLASTY;  Surgeon: Bennye Alm, MD;  Location: [facility];  Service: Gynecology;  Laterality: N/A;  . left arm surgery     has screws  . OTHER SURGICAL HISTORY  15 years ago   Broken Arm  . WISDOM TOOTH EXTRACTION  2008     reports that she has never smoked. She has never used smokeless tobacco. She reports that she drinks alcohol. She reports that she does not use drugs.  Allergies  Allergen Reactions  . Effexor [Venlafaxine] Other (See Comments)    Acute angle closure  glaucoma Lost eye sight for 2 weeks  . Chlorhexidine Gluconate Hives, Itching, Dermatitis and Rash    Family History  Problem Relation Age of Onset  . Thyroid disease Mother   . Hypertension Father     Prior to Admission medications   Medication Sig Start Date End Date Taking? Authorizing Provider  acetaminophen (TYLENOL) 325 MG tablet Take 325 mg by mouth every 6 (six) hours as needed for mild pain.   Yes [provider]  amphetamine-dextroamphetamine (ADDERALL) 30 MG tablet Take 1 tablet by mouth 2 (two) times daily. When at work 05/13/17  Yes [provider]  ampicillin (PRINCIPEN) 500 MG capsule Take 500 mg by mouth 2 (two) times daily.    Yes [provider]  diazepam (VALIUM) 5 MG tablet Take 5 mg by mouth 3 (three) times daily as needed for anxiety.   Yes [provider]  gabapentin (NEURONTIN) 300 MG capsule Take 300 mg by mouth every 8 (eight) hours.   Yes [provider]  hydrocortisone 2.5 % ointment Apply topically 2 (two) times daily. Uses 5% which is formulary, will bring in bottle to be verified by pharmacy   Yes [provider]  hydrOXYzine (VISTARIL) 50 MG capsule Take 50-100 mg by mouth at bedtime.   Yes [provider]  ibuprofen (ADVIL,MOTRIN) 800 MG tablet Take 800 mg by mouth 3 (three) times daily.   Yes [provider]  LINZESS 290 MCG CAPS capsule Take 290 mg by mouth  daily. 05/20/17  Yes [provider]  mirabegron ER (MYRBETRIQ) 25 MG TB24 tablet Take 25 mg by mouth daily.   Yes [provider]  omeprazole (PRILOSEC) 20 MG capsule Take 20 mg by mouth daily.   Yes [provider]  ondansetron (ZOFRAN-ODT) 8 MG disintegrating tablet Take 8 mg by mouth daily. With Trintellix   Yes [provider]  oxyCODONE (OXY IR/ROXICODONE) 5 MG immediate release tablet Take 5 mg by mouth every 6 (six) hours as needed for severe pain.   Yes [provider]    senna-docusate (SENOKOT-S) 8.6-50 MG tablet Take 1 tablet by mouth at bedtime.   Yes [provider]  traZODone (DESYREL) 50 MG tablet Take 50 mg by mouth at bedtime as needed for sleep. for sleep 05/10/17  Yes [provider]  vortioxetine HBr (TRINTELLIX) 20 MG TABS Take 20 mg by mouth daily.   Yes [provider]  sulfamethoxazole-trimethoprim (BACTRIM DS,SEPTRA DS) 800-160 MG tablet Take 1 tablet by mouth 2 (two) times daily. For 14 days 05/21/17   [provider]    Physical Exam: Vitals:   05/22/17 0130 05/22/17 0330 05/22/17 0500 05/22/17 0545  BP: 107/63  (!) 94/48 (!) 93/52  Pulse:   75   Resp:   16   Temp: 100.3 F (37.9 C) 99.8 F (37.7 C) 98.5 F (36.9 C)   TempSrc: Oral Oral Oral   SpO2: 95%  92%   Weight:      Height:          Constitutional: Moderately built and nourished. Vitals:   05/22/17 0130 05/22/17 0330 05/22/17 0500 05/22/17 0545  BP: 107/63  (!) 94/48 (!) 93/52  Pulse:   75   Resp:   16   Temp: 100.3 F (37.9 C) 99.8 F (37.7 C) 98.5 F (36.9 C)   TempSrc: Oral Oral Oral   SpO2: 95%  92%   Weight:      Height:       Eyes: Anicteric no pallor. ENMT: No discharge from the ears eyes nose or mouth. Neck: No JVD appreciated no mass felt. Respiratory: No rhonchi or crepitations. Cardiovascular: S1-S2 heard no murmurs appreciated. Abdomen: Soft nontender bowel sounds present. Musculoskeletal: Mild edema both lower extremities. Skin: Mild discharge from the right side of the chest where the tube was. Neurologic: Alert awake oriented to time place and person. Moves all extremities. Psychiatric: Appears normal. Normal affect.   Labs on Admission: I have personally reviewed following labs and imaging studies  CBC:  Recent Labs Lab 05/21/17 1620 05/21/17 1715 05/22/17 0414  WBC 7.1  --  6.9  NEUTROABS 4.4  --   --   HGB 11.3* 11.2* 10.5*  HCT 35.8* 33.0* 33.2*  MCV 88.6  --  87.8  PLT 479*  --  449*    Basic Metabolic Panel:  Recent Labs Lab 05/21/17 1620 05/21/17 1715 05/22/17 0414  NA 137 138 137  K 4.5 4.5 3.5  CL 103 104 102  CO2 23  --  27  GLUCOSE 93 88 101*  BUN CREATININE 0.81 0.70 0.83  CALCIUM 9.0  --  8.2*   GFR: Estimated Creatinine Clearance: 101.8 mL/min (by C-G formula based on SCr of 0.83 mg/dL). Liver Function Tests:  Recent Labs Lab 05/21/17 1620  AST 25  ALT 36  ALKPHOS 63  BILITOT <0.1*  PROT 6.6  ALBUMIN 3.2*   No results for input(s): LIPASE, AMYLASE in the last 168  hours. No results for input(s): AMMONIA in the last 168 hours. Coagulation Profile: No results for input(s): INR, PROTIME in the last 168 hours. Cardiac Enzymes: No results for input(s): CKTOTAL, CKMB, CKMBINDEX, TROPONINI in the last 168 hours. BNP (last 3 results) No results for input(s): PROBNP in the last 8760 hours. HbA1C: No results for input(s): HGBA1C in the last 72 hours. CBG: No results for input(s): GLUCAP in the last 168 hours. Lipid Profile: No results for input(s): CHOL, HDL, LDLCALC, TRIG, CHOLHDL, LDLDIRECT in the last 72 hours. Thyroid Function Tests: No results for input(s): TSH, T4TOTAL, FREET4, T3FREE, THYROIDAB in the last 72 hours. Anemia Panel: No results for input(s): VITAMINB12, FOLATE, FERRITIN, TIBC, IRON, RETICCTPCT in the last 72 hours. Urine analysis:    Component Value Date/Time   COLORURINE YELLOW 05/21/2017 1637   APPEARANCEUR CLEAR 05/21/2017 1637   APPEARANCEUR Clear 11/08/2011 2017   LABSPEC 1.015 05/21/2017 1637   LABSPEC 1.008 11/08/2011 2017   PHURINE 5.0 05/21/2017 1637   GLUCOSEU NEGATIVE 05/21/2017 1637   GLUCOSEU Negative 11/08/2011 2017   HGBUR NEGATIVE 05/21/2017 1637   BILIRUBINUR NEGATIVE 05/21/2017 1637   BILIRUBINUR - 03/30/2014 1356   BILIRUBINUR Negative 11/08/2011 2017   KETONESUR NEGATIVE 05/21/2017 1637   PROTEINUR NEGATIVE 05/21/2017 1637   UROBILINOGEN negative 03/30/2014 1356   NITRITE NEGATIVE  05/21/2017 1637   LEUKOCYTESUR NEGATIVE 05/21/2017 1637   LEUKOCYTESUR Negative 11/08/2011 2017   Sepsis Labs: (procalcitonin:4,lacticidven:4) )No results found for this or any previous visit (from the past 240 hour(s)).   Radiological Exams on Admission: Dg Chest 2 View  Result Date: 05/21/2017 CLINICAL DATA:  Chest pain and shortness of breath, fever, foul-smelling drainage from RIGHT-side of chest, thoracic surgery on 05/02/2017 with placement of titanium rods at anterior chest EXAM: CHEST  2 VIEW COMPARISON:  None FINDINGS: Metallic foreign bodies traverse the anterior chest. Normal heart size, mediastinal contours, and pulmonary vascularity. Bibasilar effusions and atelectasis greater on LEFT. Remaining lungs clear. No pleural effusion or pneumothorax. Bones unremarkable. IMPRESSION: Bibasilar atelectasis and pleural effusions LEFT greater than RIGHT. Electronically Signed   By: Ulyses Southward M.D.   On: 05/21/2017 17:36   Ct Angio Chest Pe W And/or Wo Contrast  Result Date: 05/21/2017 CLINICAL DATA:  Chest pain, abdomen pain and fever. EXAM: CT ANGIOGRAPHY CHEST CT ABDOMEN AND PELVIS WITH CONTRAST TECHNIQUE: Multidetector CT imaging of the chest was performed using the standard protocol during bolus administration of intravenous contrast. Multiplanar CT image reconstructions and MIPs were obtained to evaluate the vascular anatomy. Multidetector CT imaging of the abdomen and pelvis was performed using the standard protocol during bolus administration of intravenous contrast. CONTRAST:  100 mL Isovue 370 intravenous COMPARISON:  Radiograph 05/21/2017 FINDINGS: CTA CHEST FINDINGS Cardiovascular: Satisfactory opacification of the pulmonary arteries to the segmental level. No evidence of pulmonary embolism. Normal heart size. No pericardial effusion. Mediastinum/Nodes: No enlarged mediastinal, hilar, or axillary lymph nodes. Thyroid gland, trachea, and esophagus demonstrate no significant  findings. Lungs/Pleura: Large pleural effusions bilaterally. Adjacent atelectatic-appearing consolidation in the posterior lung bases. Airways are patent and normal in caliber. Musculoskeletal: Prior chest wall surgery with two plates anterior to the lower ribs and posterior to the sternum and xiphoid. No acute bony abnormality. Review of the MIP images confirms the above findings. CT ABDOMEN and PELVIS FINDINGS Hepatobiliary: No focal liver abnormality is seen. No gallstones, gallbladder wall thickening, or biliary dilatation. Pancreas: Unremarkable. No pancreatic ductal dilatation or surrounding inflammatory changes. Spleen: Normal in size without focal abnormality.  Adrenals/Urinary Tract: Adrenal glands are unremarkable. Kidneys are normal, without renal calculi, focal lesion, or hydronephrosis. Bladder is unremarkable. Stomach/Bowel: Stomach is within normal limits. Appendix appears normal. Large volume stool throughout the colon, without obstruction or inflammation. No evidence of bowel wall thickening, distention, or inflammatory changes. Vascular/Lymphatic: No significant vascular findings are present. No enlarged abdominal or pelvic lymph nodes. Reproductive: Uterus and bilateral adnexa are unremarkable. Other: No acute inflammation.  No ascites. Musculoskeletal: No significant skeletal lesion. Review of the MIP images confirms the above findings. IMPRESSION: 1. Negative for acute pulmonary embolism. 2. Large pleural effusions bilaterally with adjacent atelectatic appearing posterior lung base opacities. 3. Large volume colonic stool without evidence of bowel obstruction, perforation or inflammation. Electronically Signed   By: Ellery Plunk M.D.   On: 05/21/2017 19:14   Ct Abdomen Pelvis W Contrast  Result Date: 05/21/2017 CLINICAL DATA:  Chest pain, abdomen pain and fever. EXAM: CT ANGIOGRAPHY CHEST CT ABDOMEN AND PELVIS WITH CONTRAST TECHNIQUE: Multidetector CT imaging of the chest was performed  using the standard protocol during bolus administration of intravenous contrast. Multiplanar CT image reconstructions and MIPs were obtained to evaluate the vascular anatomy. Multidetector CT imaging of the abdomen and pelvis was performed using the standard protocol during bolus administration of intravenous contrast. CONTRAST:  100 mL Isovue 370 intravenous COMPARISON:  Radiograph 05/21/2017 FINDINGS: CTA CHEST FINDINGS Cardiovascular: Satisfactory opacification of the pulmonary arteries to the segmental level. No evidence of pulmonary embolism. Normal heart size. No pericardial effusion. Mediastinum/Nodes: No enlarged mediastinal, hilar, or axillary lymph nodes. Thyroid gland, trachea, and esophagus demonstrate no significant findings. Lungs/Pleura: Large pleural effusions bilaterally. Adjacent atelectatic-appearing consolidation in the posterior lung bases. Airways are patent and normal in caliber. Musculoskeletal: Prior chest wall surgery with two plates anterior to the lower ribs and posterior to the sternum and xiphoid. No acute bony abnormality. Review of the MIP images confirms the above findings. CT ABDOMEN and PELVIS FINDINGS Hepatobiliary: No focal liver abnormality is seen. No gallstones, gallbladder wall thickening, or biliary dilatation. Pancreas: Unremarkable. No pancreatic ductal dilatation or surrounding inflammatory changes. Spleen: Normal in size without focal abnormality. Adrenals/Urinary Tract: Adrenal glands are unremarkable. Kidneys are normal, without renal calculi, focal lesion, or hydronephrosis. Bladder is unremarkable. Stomach/Bowel: Stomach is within normal limits. Appendix appears normal. Large volume stool throughout the colon, without obstruction or inflammation. No evidence of bowel wall thickening, distention, or inflammatory changes. Vascular/Lymphatic: No significant vascular findings are present. No enlarged abdominal or pelvic lymph nodes. Reproductive: Uterus and bilateral  adnexa are unremarkable. Other: No acute inflammation.  No ascites. Musculoskeletal: No significant skeletal lesion. Review of the MIP images confirms the above findings. IMPRESSION: 1. Negative for acute pulmonary embolism. 2. Large pleural effusions bilaterally with adjacent atelectatic appearing posterior lung base opacities. 3. Large volume colonic stool without evidence of bowel obstruction, perforation or inflammation. Electronically Signed   By: Ellery Plunk M.D.   On: 05/21/2017 19:14      Assessment/Plan Principal Problem:   Bilateral pleural effusion Active Problems:   Pleural effusion    1. Large bilateral pleural effusion status post recent surgery for pectus excavatum - as advised by on-call cardiothoracic surgeon Dr. Laneta Simmers ultrasound-guided thoracentesis has been ordered. Patient will be continued on NSAIDs and patient's home dose of pain relief medication including oxycodone, gabapentin and antianxiety Valium. Patient is also receiving when necessary morphine for breakthrough pain. Recheck x-ray after thoracentesis. Thoracentesis fluid labs also has been ordered. CT scan showing possible opacities. Patient is presently  empirically on Bactrim. 2. Discharge from the drain site - patient has mild purulent discharge from the drain site on the right side of the chest. Patient has been placed on IV Bactrim. 3. Normocytic normochromic anemia - follow CBC. If there is no significant fall in hemoglobin and further workup as outpatient. 4. Constipation - likely related to pain medications. Patient is on senna, Mira lax and lactulose. I have ordered 1 dose of Movantik.   DVT prophylaxis: SCDs in anticipation of thoracentesis. Code Status: Full code.  Family Communication: Patient's mother.  Disposition Plan: Home.  Consults called: IR.  Admission status: Observation.    Eduard Clos MD [facility] Pager [phone].  If 7PM-7AM, please contact  night-coverage [email or web address] Password [email or web address]  05/22/2017, 6:19 AM

## 2017-05-22 NOTE — Progress Notes (Signed)
To MD on Rounds/ IR MD . Pt is asking if it is ok to get  Pain medicine in IR. BP 95/54. Hydrocortisone for rash . Also Pt is very ALLERGIC  to CHG .

## 2017-05-22 NOTE — Procedures (Signed)
Ultrasound-guided diagnostic and therapeutic left thoracentesis performed yielding 1.4 liters of serosanguineous colored fluid. No immediate complications. Follow-up chest x-ray pending.       Daphnee Preiss E 1:08 PM 05/22/2017

## 2017-05-22 NOTE — Progress Notes (Signed)
Patient was in her bed. No family member present at the time but came in later. Patient and mother were very appreciative of my visitation. Both were not ready for Advance directives and asked to have more time to reflect on it. Chaplain Zaeda Mcferran provided reflective  Listening, compassionate presence and prayer on the mother's request.   Chaplain Raevon Broom.

## 2017-05-22 NOTE — Progress Notes (Signed)
Pharmacy Antibiotic Note  Kayla Livingston is a 26 y.o. female admitted on 05/21/2017 with wound infection s/p recent surgery for pectus excavatum in [place].  Pharmacy has been consulted for Vancomycin dosing for the wound infection, discharge from right side of chest. Discontinued IV Bactrim Pt remains afebrile and WBC is WNL. SCr is WNL at 0.83. S/p thoracentesis of pleual effusion today.   Plan: Vancomycin 750 mg IV q8h  F/u renal fxn, C&S, clinical status, steady state vancomycin trough.   Height:  (172.7 cm) Weight: 157 lb 4.8 oz (71.4 kg) IBW/kg (Calculated) : 63.9  Temp (24hrs), Avg:98.8 F (37.1 C), Min:97.8 F (36.6 C), Max:100.3 F (37.9 C)   Recent Labs Lab 05/21/17 1620 05/21/17 1635 05/21/17 1715 05/22/17 0414  WBC 7.1  --   --  6.9  CREATININE 0.81  --  0.70 0.83  LATICACIDVEN  --  1.14  --   --     Estimated Creatinine Clearance: 101.8 mL/min (by C-G formula based on SCr of 0.83 mg/dL).    Allergies  Allergen Reactions  . 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 Chlorhexidine Gluconate Hives, Itching, Dermatitis and Rash    Antimicrobials this admission: Bactrim 10/9>>  Dose adjustments this admission: N/A  Microbiology results: Pending  Thank you for allowing pharmacy to be a part of this patient's care.  Arman Filter 05/22/2017 2:16 PM

## 2017-05-22 NOTE — Progress Notes (Signed)
This is a no charge note.  Please see admission H&P from Dr. Toniann Fail from earlier today.  Patient admitted overnight for bilateral effusions in the setting of recent thoracotomy. Left-sided thoracentesis today, will require right-sided thoracentesis tomorrow. Possible discharge tomorrow after thoracentesis.

## 2017-05-22 NOTE — Plan of Care (Signed)
Problem: Pain Managment: Goal: General experience of comfort will improve Outcome: Progressing Home pain management added, PRN medication available.   Problem: Physical Regulation: Goal: Ability to maintain clinical measurements within normal limits will improve Outcome: Progressing Upon arrival to floor patient had rigors. Temperature did elevate to 100.3 but was treated with ibuprofen and acetaminophen. By end of shift, patient afebrile. BP trended down during shift, most significantly after receiving night meds/falling asleep. 500cc bolus ordered this morning.   Problem: Skin Integrity: Goal: Risk for impaired skin integrity will decrease Outcome: Progressing Minimal drainage noted to old chest tube site.

## 2017-05-23 ENCOUNTER — Inpatient Hospital Stay (HOSPITAL_COMMUNITY): Payer: No Typology Code available for payment source

## 2017-05-23 ENCOUNTER — Telehealth: Payer: Self-pay | Admitting: Student

## 2017-05-23 LAB — COMPREHENSIVE METABOLIC PANEL
ALT: 23 U/L (ref 14–54)
ANION GAP: 5 (ref 5–15)
AST: 17 U/L (ref 15–41)
Albumin: 2.6 g/dL — ABNORMAL LOW (ref 3.5–5.0)
Alkaline Phosphatase: 54 U/L (ref 38–126)
BUN: 11 mg/dL (ref 6–20)
CHLORIDE: 107 mmol/L (ref 101–111)
CO2: 26 mmol/L (ref 22–32)
CREATININE: 0.84 mg/dL (ref 0.44–1.00)
Calcium: 8.4 mg/dL — ABNORMAL LOW (ref 8.9–10.3)
Glucose, Bld: 109 mg/dL — ABNORMAL HIGH (ref 65–99)
Potassium: 4.6 mmol/L (ref 3.5–5.1)
SODIUM: 138 mmol/L (ref 135–145)
Total Bilirubin: 0.3 mg/dL (ref 0.3–1.2)
Total Protein: 5.5 g/dL — ABNORMAL LOW (ref 6.5–8.1)

## 2017-05-23 LAB — PH, BODY FLUID: PH, BODY FLUID: 7.7

## 2017-05-23 LAB — CBC
HEMATOCRIT: 33 % — AB (ref 36.0–46.0)
Hemoglobin: 10.6 g/dL — ABNORMAL LOW (ref 12.0–15.0)
MCH: 28.2 pg (ref 26.0–34.0)
MCHC: 32.1 g/dL (ref 30.0–36.0)
MCV: 87.8 fL (ref 78.0–100.0)
PLATELETS: 379 10*3/uL (ref 150–400)
RBC: 3.76 MIL/uL — AB (ref 3.87–5.11)
RDW: 13.4 % (ref 11.5–15.5)
WBC: 6.1 10*3/uL (ref 4.0–10.5)

## 2017-05-23 LAB — LACTATE DEHYDROGENASE: LDH: 144 U/L (ref 98–192)

## 2017-05-23 LAB — PATHOLOGIST SMEAR REVIEW: Path Review: REACTIVE

## 2017-05-23 MED ORDER — MORPHINE SULFATE (PF) 4 MG/ML IV SOLN
INTRAVENOUS | Status: AC
  Filled 2017-05-23: qty 1

## 2017-05-23 MED ORDER — LIDOCAINE 2% (20 MG/ML) 5 ML SYRINGE
INTRAMUSCULAR | Status: AC
  Filled 2017-05-23: qty 10

## 2017-05-23 MED ORDER — SULFAMETHOXAZOLE-TRIMETHOPRIM 400-80 MG/5ML IV SOLN
5.0000 mg/kg | Freq: Three times a day (TID) | INTRAVENOUS | Status: DC
  Administered 2017-05-23 – 2017-05-24 (×3): 369.6 mg via INTRAVENOUS
  Filled 2017-05-23 (×4): qty 23.1

## 2017-05-23 MED ORDER — DIPHENHYDRAMINE HCL 25 MG PO CAPS
25.0000 mg | ORAL_CAPSULE | Freq: Four times a day (QID) | ORAL | Status: DC | PRN
  Administered 2017-05-23 – 2017-05-24 (×2): 25 mg via ORAL
  Filled 2017-05-23 (×2): qty 1

## 2017-05-23 MED ORDER — METHYLPREDNISOLONE SODIUM SUCC 40 MG IJ SOLR
40.0000 mg | Freq: Once | INTRAMUSCULAR | Status: AC
  Administered 2017-05-23: 40 mg via INTRAVENOUS
  Filled 2017-05-23: qty 1

## 2017-05-23 MED ORDER — NALOXEGOL OXALATE 25 MG PO TABS
25.0000 mg | ORAL_TABLET | Freq: Every day | ORAL | Status: DC
  Administered 2017-05-23: 25 mg via ORAL
  Filled 2017-05-23: qty 1

## 2017-05-23 MED ORDER — PROMETHAZINE HCL 25 MG/ML IJ SOLN: 12.5000 mg | Freq: Four times a day (QID) | INTRAMUSCULAR | Status: DC | PRN

## 2017-05-23 MED ORDER — PROMETHAZINE HCL 25 MG/ML IJ SOLN
12.5000 mg | Freq: Once | INTRAMUSCULAR | Status: AC
  Administered 2017-05-23: 12.5 mg via INTRAVENOUS
  Filled 2017-05-23: qty 1

## 2017-05-23 NOTE — Progress Notes (Signed)
  Kayla Livingston was in our department for a diagnostic and therapeutic right thoracentesis.  She had a left thora yesterday = 1.4 liters removed.  US of the chest showed small effusions bilaterally.  Long discussion with [name] and her mom.  Risks and benefits of the thora were discussed with the patient and her mom including, but not limited to bleeding, infection, chance of pneumothorax requiring chest tube placement, and minimally therapeutic due to only a small effusion.  All of the patient's questions were answered to the best of my ability.  Dr. Lowella Dandy also saw patient with me and performed limited US of the chest.  She would like to hold off on thora for now and she is requesting a thoracic surgeon to evaluate her.  Her surgeon is in [place] and she would like to see someone here.  I have relayed this message to her Hospitalist and I have also spoken with the charge nurse on 6E.   [name] also requested transfer to a larger room, maybe in the [facility]. Unsure if this is possible but I also relayed this to the nurse on 6E.   [name] S Naquisha Whitehair PA-C 05/23/2017 3:25 PM

## 2017-05-23 NOTE — Progress Notes (Signed)
Pharmacy Antibiotic Note  Kayla Livingston is a 26 y.o. female admitted on 05/21/2017 with wound infection s/p recent surgery for pectus excavatum in [place]. Pt was started on Bactrim, this was changed to vancomycin yesterday and now will return to Bactrim. Pt had an allergic reaction to vancomycin, rash and hives on bilateral lower extremities.  Plan: Bactrim 5 mg/kg IV q8h Monitor renal fx, duration, cultures   Height:  (172.7 cm) Weight: 162 lb 11.2 oz (73.8 kg) IBW/kg (Calculated) : 63.9  Temp (24hrs), Avg:98.1 F (36.7 C), Min:97.2 F (36.2 C), Max:98.4 F (36.9 C)   Recent Labs Lab 05/21/17 1620 05/21/17 1635 05/21/17 1715 05/22/17 0414 05/23/17 0533  WBC 7.1  --   --  6.9 6.1  CREATININE 0.81  --  0.70 0.83 0.84  LATICACIDVEN  --  1.14  --   --   --     Estimated Creatinine Clearance: 100.6 mL/min (by C-G formula based on SCr of 0.84 mg/dL).    Allergies  Allergen Reactions  . 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 Chlorhexidine Gluconate Hives, Itching, Dermatitis and Rash  . Vancomycin Hives    Red welts, hives    Antimicrobials this admission: Bactrim 10/9 x1; 10/11 >> Vancomycin 10/10 > 10/11   Dose adjustments this admission: N/A  Microbiology results: Pending    Baldemar Friday 05/23/2017 11:27 AM

## 2017-05-23 NOTE — Progress Notes (Signed)
PROGRESS NOTE    Kayla Livingston  ZOX:[id number] DOB: May 25, 1991 DOA: 05/21/2017 PCP: System, Pcp Not In  Outpatient Specialists:  Dr. Jacques Earthly, CT surgery [facility]    Brief Narrative:  The patient was in her usual state of helath for about 3 weeks post-operatively from her pes excavatum reconstruction until about four days ago when she started to feel dyspnea.  She subsequently noticed purulent foul-smelling drainage from her removed chest tube site.  She called her surgeon by phone, who recommended evaluation in the ER and starting oral Bactrim for suspected superficial surgical site or chest tube site infection.   Assessment & Plan:  Principal Problem:   Bilateral pleural effusion Active Problems:   Pleural effusion   Pleural effusion Thoracentesis was performed yesterday, yielding 1.4L thick reddish liquid.  Exudate, gram stain negative.  Today, repeat was attempted but no fluid was able to be located.  Patient has remained afebrile, normal hemodynamics.  I have been in touch with her CT surgeon's PA in [place], who said they typically diurese patient's in this case, which doesn't seem reasonable given her thick exudative effusion.   -Will discuss with CT surgery re: need for exploration of her old chest tube site, and how to rule out hardware infection in this patient -Follow blood cultures and body fluid culture yesterday  Rash: Developed red rash on legs following vancomycin.        DVT prophylaxis: SCDs Code Status: FULL Family Communication: Mother at bedside Disposition Plan: Will discuss with CT surgery.  If no further dissection of her chest tube site is not necessary or further exploration is not necessary, would recommend discharge with outpatient follow up and continued empiric Bactrim.   Consultants:   CT surgery  Procedures:   Thoracentesis  Antimicrobials:   Bactrim IV    Subjective: Intermittently feeling short of breath, then fine.  Feels  some chest pain on right.  No fever, chills, cough.  Objective: Vitals:   05/23/17 1057 05/23/17 1100 05/23/17 1313 05/23/17 1632  BP: 117/61 (!) 107/52 113/67 (!) 106/55  Pulse:   87 81  Resp:      Temp: 97.9 F (36.6 C) 97.9 F (36.6 C)  98.4 F (36.9 C)  TempSrc:    Oral  SpO2: 100% 98%    Weight:      Height:        Intake/Output Summary (Last 24 hours) at 05/23/17 1702 Last data filed at 05/23/17 0000  Gross per 24 hour  Intake              650 ml  Output                0 ml  Net              650 ml   Filed Weights   05/21/17 1610 05/21/17 2310 05/23/17 0534  Weight: 74.8 kg (165 lb) 71.4 kg (157 lb 4.8 oz) 73.8 kg (162 lb 11.2 oz)    Examination:  General exam: Appears anxious but in no acute distress Respiratory system: Clear to auscultation. Respiratory effort normal. Cardiovascular system: S1 & S2 heard, RRR. No JVD, murmurs, rubs, gallops or clicks. No pedal edema. Gastrointestinal system: Abdomen is nondistended, soft and nontender. No organomegaly or masses felt. Normal bowel sounds heard. Central nervous system: Alert and oriented. No focal neurological deficits. Extremities: Symmetric 5 x 5 power. Skin: No rashes, lesions or ulcers.  The chest tube site has a small amount of granulation  tissue, but has no drainage, no surrounding redness, no tenderness Psychiatry: Judgement and insight appear normal. Mood & affect appropriate.     Data Reviewed: I have personally reviewed following labs and imaging studies  CBC:  Recent Labs Lab 05/21/17 1620 05/21/17 1715 05/22/17 0414 05/23/17 0533  WBC 7.1  --  6.9 6.1  NEUTROABS 4.4  --   --   --   HGB 11.3* 11.2* 10.5* 10.6*  HCT 35.8* 33.0* 33.2* 33.0*  MCV 88.6  --  87.8 87.8  PLT 479*  --  449* 379   Basic Metabolic Panel:  Recent Labs Lab 05/21/17 1620 05/21/17 1715 05/22/17 0414 05/23/17 0533  NA 137 138 137 138  K 4.5 4.5 3.5 4.6  CL 103 104 102 107  CO2 23  --  27 26  GLUCOSE 93 88 101*  109*  BUN CREATININE 0.81 0.70 0.83 0.84  CALCIUM 9.0  --  8.2* 8.4*   GFR: Estimated Creatinine Clearance: 100.6 mL/min (by C-G formula based on SCr of 0.84 mg/dL). Liver Function Tests:  Recent Labs Lab 05/21/17 1620 05/23/17 0533  AST 25 17  ALT 36 23  ALKPHOS 63 54  BILITOT <0.1* 0.3  PROT 6.6 5.5*  ALBUMIN 3.2* 2.6*   No results for input(s): LIPASE, AMYLASE in the last 168 hours. No results for input(s): AMMONIA in the last 168 hours. Coagulation Profile: No results for input(s): INR, PROTIME in the last 168 hours. Cardiac Enzymes: No results for input(s): CKTOTAL, CKMB, CKMBINDEX, TROPONINI in the last 168 hours. BNP (last 3 results) No results for input(s): PROBNP in the last 8760 hours. HbA1C: No results for input(s): HGBA1C in the last 72 hours. CBG: No results for input(s): GLUCAP in the last 168 hours. Lipid Profile: No results for input(s): CHOL, HDL, LDLCALC, TRIG, CHOLHDL, LDLDIRECT in the last 72 hours. Thyroid Function Tests: No results for input(s): TSH, T4TOTAL, FREET4, T3FREE, THYROIDAB in the last 72 hours. Anemia Panel: No results for input(s): VITAMINB12, FOLATE, FERRITIN, TIBC, IRON, RETICCTPCT in the last 72 hours. Urine analysis:    Component Value Date/Time   COLORURINE YELLOW 05/21/2017 1637   APPEARANCEUR CLEAR 05/21/2017 1637   APPEARANCEUR Clear 11/08/2011 2017   LABSPEC 1.015 05/21/2017 1637   LABSPEC 1.008 11/08/2011 2017   PHURINE 5.0 05/21/2017 1637   GLUCOSEU NEGATIVE 05/21/2017 1637   GLUCOSEU Negative 11/08/2011 2017   HGBUR NEGATIVE 05/21/2017 1637   BILIRUBINUR NEGATIVE 05/21/2017 1637   BILIRUBINUR - 03/30/2014 1356   BILIRUBINUR Negative 11/08/2011 2017   KETONESUR NEGATIVE 05/21/2017 1637   PROTEINUR NEGATIVE 05/21/2017 1637   UROBILINOGEN negative 03/30/2014 1356   NITRITE NEGATIVE 05/21/2017 1637   LEUKOCYTESUR NEGATIVE 05/21/2017 1637   LEUKOCYTESUR Negative 11/08/2011 2017   Sepsis  Labs: (procalcitonin:4,lacticidven:4)  ) Recent Results (from the past 240 hour(s))  Blood culture (routine x 2)     Status: None (Preliminary result)   Collection Time: 05/21/17  4:05 PM  Result Value Ref Range Status   Specimen Description BLOOD LEFT ANTECUBITAL  Final   Special Requests   Final    BOTTLES DRAWN AEROBIC AND ANAEROBIC Blood Culture adequate volume   Culture NO GROWTH 2 DAYS  Final   Report Status PENDING  Incomplete  Blood culture (routine x 2)     Status: None (Preliminary result)   Collection Time: 05/21/17  4:25 PM  Result Value Ref Range Status   Specimen Description BLOOD LEFT ANTECUBITAL  Final   Special  Requests   Final    BOTTLES DRAWN AEROBIC ONLY Blood Culture adequate volume   Culture NO GROWTH 2 DAYS  Final   Report Status PENDING  Incomplete  MRSA PCR Screening     Status: None   Collection Time: 05/22/17  1:25 AM  Result Value Ref Range Status   MRSA by PCR NEGATIVE NEGATIVE Final    Comment:        The GeneXpert MRSA Assay (FDA approved for NASAL specimens only), is one component of a comprehensive MRSA colonization surveillance program. It is not intended to diagnose MRSA infection nor to guide or monitor treatment for MRSA infections.   Culture, body fluid-bottle     Status: None (Preliminary result)   Collection Time: 05/22/17 12:36 PM  Result Value Ref Range Status   Specimen Description PLEURAL LEFT  Final   Special Requests NONE  Final   Culture NO GROWTH 1 DAY  Final   Report Status PENDING  Incomplete  Gram stain     Status: None   Collection Time: 05/22/17 12:36 PM  Result Value Ref Range Status   Specimen Description PLEURAL LEFT  Final   Special Requests NONE  Final   Gram Stain   Final    RARE WBC PRESENT,BOTH PMN AND MONONUCLEAR NO ORGANISMS SEEN    Report Status 05/22/2017 FINAL  Final         Radiology Studies: Dg Chest 1 View  Result Date: 05/23/2017 CLINICAL DATA:  Right pleural effusion.  Right-sided chest pain. History of chest surgery with insertion of metal bars under sternum. EXAM: CHEST 1 VIEW COMPARISON:  Chest x-ray dated 05/22/2017. FINDINGS: No convincing pleural effusion seen on today's exam. Tenting of the left hemidiaphragm suggests some degree of overlying atelectasis or scarring. Heart size and mediastinal contours are within normal limits. The metal ball ars projected over the mid thorax, presumably for correction of pectus excavatum, are stable in alignment. Stable mild scoliosis of the thoracic spine. IMPRESSION: No convincing pleural effusion appreciated on today's exam. Tenting of the left hemidiaphragm suggests some overlying atelectasis or scarring. Electronically Signed   By: Bary Richard M.D.   On: 05/23/2017 14:29   Dg Chest 1 View  Result Date: 05/22/2017 CLINICAL DATA:  26 year old female status post thoracentesis. EXAM: CHEST 1 VIEW COMPARISON:  Chest x-ray 05/21/2017. FINDINGS: Small bilateral pleural effusions (left greater than right), decreased compared to the prior examination. No appreciable pneumothorax. Probable subsegmental atelectasis in the left lower lobe. Right lung appears clear. No evidence of pulmonary edema. Heart size is normal. Upper mediastinal contours are within normal limits. Two metal bars are noted projecting over the mid thorax for correction of pectus excavatum. IMPRESSION: 1. Decreased size of what are now all small bilateral pleural effusions. No pneumothorax. 2. Decreasing left lower lobe subsegmental atelectasis. Electronically Signed   By: Trudie Reed M.D.   On: 05/22/2017 13:03   Dg Chest 2 View  Result Date: 05/21/2017 CLINICAL DATA:  Chest pain and shortness of breath, fever, foul-smelling drainage from RIGHT-side of chest, thoracic surgery on 05/02/2017 with placement of titanium rods at anterior chest EXAM: CHEST  2 VIEW COMPARISON:  None FINDINGS: Metallic foreign bodies traverse the anterior chest. Normal heart size,  mediastinal contours, and pulmonary vascularity. Bibasilar effusions and atelectasis greater on LEFT. Remaining lungs clear. No pleural effusion or pneumothorax. Bones unremarkable. IMPRESSION: Bibasilar atelectasis and pleural effusions LEFT greater than RIGHT. Electronically Signed   By: Ulyses Southward M.D.   On: 05/21/2017  17:36   Ct Angio Chest Pe W And/or Wo Contrast  Result Date: 05/21/2017 CLINICAL DATA:  Chest pain, abdomen pain and fever. EXAM: CT ANGIOGRAPHY CHEST CT ABDOMEN AND PELVIS WITH CONTRAST TECHNIQUE: Multidetector CT imaging of the chest was performed using the standard protocol during bolus administration of intravenous contrast. Multiplanar CT image reconstructions and MIPs were obtained to evaluate the vascular anatomy. Multidetector CT imaging of the abdomen and pelvis was performed using the standard protocol during bolus administration of intravenous contrast. CONTRAST:  100 mL Isovue 370 intravenous COMPARISON:  Radiograph 05/21/2017 FINDINGS: CTA CHEST FINDINGS Cardiovascular: Satisfactory opacification of the pulmonary arteries to the segmental level. No evidence of pulmonary embolism. Normal heart size. No pericardial effusion. Mediastinum/Nodes: No enlarged mediastinal, hilar, or axillary lymph nodes. Thyroid gland, trachea, and esophagus demonstrate no significant findings. Lungs/Pleura: Large pleural effusions bilaterally. Adjacent atelectatic-appearing consolidation in the posterior lung bases. Airways are patent and normal in caliber. Musculoskeletal: Prior chest wall surgery with two plates anterior to the lower ribs and posterior to the sternum and xiphoid. No acute bony abnormality. Review of the MIP images confirms the above findings. CT ABDOMEN and PELVIS FINDINGS Hepatobiliary: No focal liver abnormality is seen. No gallstones, gallbladder wall thickening, or biliary dilatation. Pancreas: Unremarkable. No pancreatic ductal dilatation or surrounding inflammatory changes.  Spleen: Normal in size without focal abnormality. Adrenals/Urinary Tract: Adrenal glands are unremarkable. Kidneys are normal, without renal calculi, focal lesion, or hydronephrosis. Bladder is unremarkable. Stomach/Bowel: Stomach is within normal limits. Appendix appears normal. Large volume stool throughout the colon, without obstruction or inflammation. No evidence of bowel wall thickening, distention, or inflammatory changes. Vascular/Lymphatic: No significant vascular findings are present. No enlarged abdominal or pelvic lymph nodes. Reproductive: Uterus and bilateral adnexa are unremarkable. Other: No acute inflammation.  No ascites. Musculoskeletal: No significant skeletal lesion. Review of the MIP images confirms the above findings. IMPRESSION: 1. Negative for acute pulmonary embolism. 2. Large pleural effusions bilaterally with adjacent atelectatic appearing posterior lung base opacities. 3. Large volume colonic stool without evidence of bowel obstruction, perforation or inflammation. Electronically Signed   By: Ellery Plunk M.D.   On: 05/21/2017 19:14   Ct Abdomen Pelvis W Contrast  Result Date: 05/21/2017 CLINICAL DATA:  Chest pain, abdomen pain and fever. EXAM: CT ANGIOGRAPHY CHEST CT ABDOMEN AND PELVIS WITH CONTRAST TECHNIQUE: Multidetector CT imaging of the chest was performed using the standard protocol during bolus administration of intravenous contrast. Multiplanar CT image reconstructions and MIPs were obtained to evaluate the vascular anatomy. Multidetector CT imaging of the abdomen and pelvis was performed using the standard protocol during bolus administration of intravenous contrast. CONTRAST:  100 mL Isovue 370 intravenous COMPARISON:  Radiograph 05/21/2017 FINDINGS: CTA CHEST FINDINGS Cardiovascular: Satisfactory opacification of the pulmonary arteries to the segmental level. No evidence of pulmonary embolism. Normal heart size. No pericardial effusion. Mediastinum/Nodes: No enlarged  mediastinal, hilar, or axillary lymph nodes. Thyroid gland, trachea, and esophagus demonstrate no significant findings. Lungs/Pleura: Large pleural effusions bilaterally. Adjacent atelectatic-appearing consolidation in the posterior lung bases. Airways are patent and normal in caliber. Musculoskeletal: Prior chest wall surgery with two plates anterior to the lower ribs and posterior to the sternum and xiphoid. No acute bony abnormality. Review of the MIP images confirms the above findings. CT ABDOMEN and PELVIS FINDINGS Hepatobiliary: No focal liver abnormality is seen. No gallstones, gallbladder wall thickening, or biliary dilatation. Pancreas: Unremarkable. No pancreatic ductal dilatation or surrounding inflammatory changes. Spleen: Normal in size without focal abnormality. Adrenals/Urinary Tract: Adrenal  glands are unremarkable. Kidneys are normal, without renal calculi, focal lesion, or hydronephrosis. Bladder is unremarkable. Stomach/Bowel: Stomach is within normal limits. Appendix appears normal. Large volume stool throughout the colon, without obstruction or inflammation. No evidence of bowel wall thickening, distention, or inflammatory changes. Vascular/Lymphatic: No significant vascular findings are present. No enlarged abdominal or pelvic lymph nodes. Reproductive: Uterus and bilateral adnexa are unremarkable. Other: No acute inflammation.  No ascites. Musculoskeletal: No significant skeletal lesion. Review of the MIP images confirms the above findings. IMPRESSION: 1. Negative for acute pulmonary embolism. 2. Large pleural effusions bilaterally with adjacent atelectatic appearing posterior lung base opacities. 3. Large volume colonic stool without evidence of bowel obstruction, perforation or inflammation. Electronically Signed   By: Ellery Plunk M.D.   On: 05/21/2017 19:14   Ir US Chest  Result Date: 05/23/2017 CLINICAL DATA:  History of pectus excavatum surgery in September. Patient has  developed shortness of breath and pleural effusions. Patient underwent a left thoracentesis on 05/22/2017. Evaluate for a right thoracentesis. EXAM: CHEST ULTRASOUND COMPARISON:  Chest radiograph 05/22/2017 and CT 05/21/2017 FINDINGS: There is a very small amount of pleural fluid in the posterior lower chest bilaterally. Although the patient just had a left thoracentesis yesterday, there is slightly more fluid on the left than the right. IMPRESSION: Very small amount of bilateral pleural fluid. Due to the small pleural fluid volume, thoracentesis was not performed. Electronically Signed   By: Richarda Overlie M.D.   On: 05/23/2017 16:50   Ir Thoracentesis Asp Pleural Space W/img Guide  Result Date: 05/22/2017 INDICATION: History of pectus excavatum, status post recent surgery for rod placements in her chest at the [facility] in [place] on September 20th. Patient admitted with new onset shortness of breath. Imaging revealed large bilateral pleural effusions. Request is made for thoracentesis. EXAM: ULTRASOUND GUIDED DIAGNOSTIC AND THERAPEUTIC THORACENTESIS MEDICATIONS: 1% Lidocaine COMPLICATIONS: None immediate. PROCEDURE: An ultrasound guided thoracentesis was thoroughly discussed with the patient and questions answered. The benefits, risks, alternatives and complications were also discussed. The patient understands and wishes to proceed with the procedure. Written consent was obtained. Ultrasound was performed to localize and mark an adequate pocket of fluid in the left chest. The area was then prepped and draped in the normal sterile fashion. 1% Lidocaine was used for local anesthesia. Under ultrasound guidance a Safe-T-Centesis catheter was introduced. Thoracentesis was performed. The catheter was removed and a dressing applied. FINDINGS: A total of approximately 1.4 L of serosanguineous fluid was removed. Samples were sent to the laboratory as requested by the clinical team. IMPRESSION: Successful ultrasound  guided left thoracentesis yielding 1.4 L of pleural fluid. Read by: Barnetta Chapel, PA-C Electronically Signed   By: Jolaine Click M.D.   On: 05/22/2017 13:21        Scheduled Meds: . acetaminophen  1,000 mg Oral QID  . ampicillin  500 mg Oral BID  . gabapentin  300 mg Oral Q8H  . hydrOXYzine  50-100 mg Oral QHS  . ibuprofen  800 mg Oral TID  . lactulose  30 g Oral Q1200  . linaclotide  290 mcg Oral Q1200  . mirabegron ER  25 mg Oral Daily  . pantoprazole  40 mg Oral Daily  . polyethylene glycol  17 g Oral BID  . senna-docusate  2 tablet Oral BID  . vortioxetine HBr  20 mg Oral QHS   Continuous Infusions: . sulfamethoxazole-trimethoprim 369.6 mg of trimethoprim (05/23/17 1601)     LOS: 1 day  Time spent: 50 minutes    Alberteen Sam, MD [facility] Pager [phone]  If 7PM-7AM, please contact night-coverage [email or web address] Password [email or web address] 05/23/2017, 5:02 PM

## 2017-05-24 ENCOUNTER — Encounter (HOSPITAL_COMMUNITY): Payer: Self-pay | Admitting: Family Medicine

## 2017-05-24 LAB — CBC
HEMATOCRIT: 33.1 % — AB (ref 36.0–46.0)
HEMOGLOBIN: 10.9 g/dL — AB (ref 12.0–15.0)
MCH: 28.5 pg (ref 26.0–34.0)
MCHC: 32.9 g/dL (ref 30.0–36.0)
MCV: 86.4 fL (ref 78.0–100.0)
PLATELETS: 412 10*3/uL — AB (ref 150–400)
RBC: 3.83 MIL/uL — AB (ref 3.87–5.11)
RDW: 13.1 % (ref 11.5–15.5)
WBC: 9.3 10*3/uL (ref 4.0–10.5)

## 2017-05-24 MED ORDER — SULFAMETHOXAZOLE-TRIMETHOPRIM 800-160 MG PO TABS: 1.0000 | ORAL_TABLET | Freq: Two times a day (BID) | ORAL | 0 refills | Status: AC

## 2017-05-24 MED ORDER — LINZESS 290 MCG PO CAPS: 290.0000 ug | ORAL_CAPSULE | Freq: Every day | ORAL | 3 refills | Status: AC

## 2017-05-24 MED ORDER — NALOXEGOL OXALATE 25 MG PO TABS: 25.0000 mg | ORAL_TABLET | Freq: Every day | ORAL | 0 refills | Status: AC

## 2017-05-24 NOTE — Progress Notes (Signed)
Prescription for hydrocortisone 5% cream called into [facility] Information discussed with patient.   Kayla Livingston  05/24/2017 11:05 AM

## 2017-05-24 NOTE — Plan of Care (Signed)
Problem: Physical Regulation: Goal: Will remain free from infection Outcome: Progressing VSS and WNL   

## 2017-05-24 NOTE — Discharge Summary (Signed)
Physician Discharge Summary  Kayla Livingston ZOX:[id number] DOB: 1991-03-26 DOA: 05/21/2017  PCP: Marva Panda, NP  Admit date: 05/21/2017 Discharge date: 05/24/2017  Admitted From: Home Disposition:  Home  Recommendations for Outpatient Follow-up:  1. Follow up with PCP in 1 week, Oct 18 for repeat 2V CXR 2. If CXR shows recurrent effusion, discuss with Dr. Robby Sermon and make arrangements for thoracentesis again  Home Health: No Equipment/Devices: No  Discharge Condition: Good  CODE STATUS: FULL Diet recommendation: Regular  Brief/Interim Summary: The patient is a 26 year old female with past medical history significant for recent Nuss bar insertion at [facility] who presented with chest discomfort, dyspnea, and drainage from her chest tube site.  CXR showed bilateral small pleural effusions.  She was given Septra for superficial chest tube site infection and thoracentesis was arranged.  After thoracentesis, there was minimal residual effusion, and the patient's pain was improved.  The patient had high concern for possible infection of her effusion or infection of her Nuss bars, but there was no fever, tachycardia or leukocytosis throughout her hospital stay.  The case was discussed with her CT surgeon Dr. Robby Sermon by phone who recommended completing 7 days Septra for POSSIBLE superficial chest tube site infection and repeat CXR in 1 week.  If reaccumulation of effusion in 1 week, thoracentesis again.   Discharge Diagnoses:  Bilateral pleural effusion   Discharge Instructions  Discharge Instructions    Activity as tolerated - No restrictions    Complete by:  As directed    Diet general    Complete by:  As directed    Discharge instructions    Complete by:  As directed    From Dr. Maryfrances Bunnell: I provided a paper prescription for Bactrim.  If you had already picked up a supply before you came to the hospital, may take that, but take: Bactrim DS one tab twice daily starting  Friday night until [facility] Oct 17 (should be 9 doses) Obtain a repeat 2 view chest x-ray on Thursday Oct 18 with Mayra Neer I provided a prescription for Movantik, in case you need this (it should have been sent electronically to [facility] on [address]).   Important reasons to call Dr. Ronnette Hila office: new and persistent fever >100.63F, new, worsening and persistent chest pain, new spreading redness and persistent drainage from your chest tube site     Allergies as of 05/24/2017      Reactions   Effexor [venlafaxine] Other (See Comments)   Acute angle closure glaucoma Lost eye sight for 2 weeks   Chlorhexidine Gluconate Hives, Itching, Dermatitis, Rash   Vancomycin Hives   Red welts, hives      Medication List    STOP taking these medications   drospirenone-ethinyl estradiol 3-0.02 MG tablet Commonly known as:  YAZ,GIANVI,LORYNA   estradiol 0.1 MG/GM vaginal cream Commonly known as:  ESTRACE VAGINAL   lidocaine 2 % jelly Commonly known as:  XYLOCAINE JELLY     TAKE these medications   acetaminophen 325 MG tablet Commonly known as:  TYLENOL Take 325 mg by mouth every 6 (six) hours as needed for mild pain.   amphetamine-dextroamphetamine 30 MG tablet Commonly known as:  ADDERALL Take 1 tablet by mouth 2 (two) times daily. When at work   ampicillin 500 MG capsule Commonly known as:  PRINCIPEN Take 500 mg by mouth 2 (two) times daily.   diazepam 5 MG tablet Commonly known as:  VALIUM Take 5 mg by mouth 3 (three) times  daily as needed for anxiety.   gabapentin 300 MG capsule Commonly known as:  NEURONTIN Take 300 mg by mouth every 8 (eight) hours.   hydrocortisone 2.5 % ointment Apply topically 2 (two) times daily. Uses 5% which is formulary, will bring in bottle to be verified by pharmacy   hydrOXYzine 50 MG capsule Commonly known as:  VISTARIL Take 50-100 mg by mouth at bedtime.   ibuprofen 800 MG tablet Commonly known as:  ADVIL,MOTRIN Take 800 mg by mouth 3  (three) times daily.   LINZESS 290 MCG Caps capsule Generic drug:  linaclotide Take 1 capsule (290 mcg total) by mouth daily. What changed:  how much to take   MYRBETRIQ 25 MG Tb24 tablet Generic drug:  mirabegron ER Take 25 mg by mouth daily.   naloxegol oxalate 25 MG Tabs tablet Commonly known as:  MOVANTIK Take 1 tablet (25 mg total) by mouth daily.   omeprazole 20 MG capsule Commonly known as:  PRILOSEC Take 20 mg by mouth daily.   ondansetron 8 MG disintegrating tablet Commonly known as:  ZOFRAN-ODT Take 8 mg by mouth daily. With Trintellix   oxyCODONE 5 MG immediate release tablet Commonly known as:  Oxy IR/ROXICODONE Take 5 mg by mouth every 6 (six) hours as needed for severe pain.   senna-docusate 8.6-50 MG tablet Commonly known as:  Senokot-S Take 1 tablet by mouth at bedtime.   sulfamethoxazole-trimethoprim 800-160 MG tablet Commonly known as:  BACTRIM DS,SEPTRA DS Take 1 tablet by mouth 2 (two) times daily. For 14 days   traZODone 50 MG tablet Commonly known as:  DESYREL Take 50 mg by mouth at bedtime as needed for sleep. for sleep   TRINTELLIX 20 MG Tabs Generic drug:  vortioxetine HBr Take 20 mg by mouth daily.       Allergies  Allergen Reactions  . 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 Chlorhexidine Gluconate Hives, Itching, Dermatitis and Rash  . Vancomycin Hives    Red welts, hives    Consultations:  CT surgery, Dr. Robby Sermon   Procedures/Studies: Dg Chest 1 View  Result Date: 05/23/2017 CLINICAL DATA:  Right pleural effusion. Right-sided chest pain. History of chest surgery with insertion of metal bars under sternum. EXAM: CHEST 1 VIEW COMPARISON:  Chest x-ray dated 05/22/2017. FINDINGS: No convincing pleural effusion seen on today's exam. Tenting of the left hemidiaphragm suggests some degree of overlying atelectasis or scarring. Heart size and mediastinal contours  are within normal limits. The metal ball ars projected over the mid thorax, presumably for correction of pectus excavatum, are stable in alignment. Stable mild scoliosis of the thoracic spine. IMPRESSION: No convincing pleural effusion appreciated on today's exam. Tenting of the left hemidiaphragm suggests some overlying atelectasis or scarring. Electronically Signed   By: Bary Richard M.D.   On: 05/23/2017 14:29   Dg Chest 1 View  Result Date: 05/22/2017 CLINICAL DATA:  26 year old female status post thoracentesis. EXAM: CHEST 1 VIEW COMPARISON:  Chest x-ray 05/21/2017. FINDINGS: Small bilateral pleural effusions (left greater than right), decreased compared to the prior examination. No appreciable pneumothorax. Probable subsegmental atelectasis in the left lower lobe. Right lung appears clear. No evidence of pulmonary edema. Heart size is normal. Upper mediastinal contours are within normal limits. Two metal bars are noted projecting over the mid thorax for correction of pectus excavatum. IMPRESSION: 1. Decreased size of what are now all small bilateral pleural effusions. No pneumothorax.  2. Decreasing left lower lobe subsegmental atelectasis. Electronically Signed   By: Trudie Reed M.D.   On: 05/22/2017 13:03   Dg Chest 2 View  Result Date: 05/21/2017 CLINICAL DATA:  Chest pain and shortness of breath, fever, foul-smelling drainage from RIGHT-side of chest, thoracic surgery on 05/02/2017 with placement of titanium rods at anterior chest EXAM: CHEST  2 VIEW COMPARISON:  None FINDINGS: Metallic foreign bodies traverse the anterior chest. Normal heart size, mediastinal contours, and pulmonary vascularity. Bibasilar effusions and atelectasis greater on LEFT. Remaining lungs clear. No pleural effusion or pneumothorax. Bones unremarkable. IMPRESSION: Bibasilar atelectasis and pleural effusions LEFT greater than RIGHT. Electronically Signed   By: Ulyses Southward M.D.   On: 05/21/2017 17:36   Ct Angio Chest  Pe W And/or Wo Contrast  Result Date: 05/21/2017 CLINICAL DATA:  Chest pain, abdomen pain and fever. EXAM: CT ANGIOGRAPHY CHEST CT ABDOMEN AND PELVIS WITH CONTRAST TECHNIQUE: Multidetector CT imaging of the chest was performed using the standard protocol during bolus administration of intravenous contrast. Multiplanar CT image reconstructions and MIPs were obtained to evaluate the vascular anatomy. Multidetector CT imaging of the abdomen and pelvis was performed using the standard protocol during bolus administration of intravenous contrast. CONTRAST:  100 mL Isovue 370 intravenous COMPARISON:  Radiograph 05/21/2017 FINDINGS: CTA CHEST FINDINGS Cardiovascular: Satisfactory opacification of the pulmonary arteries to the segmental level. No evidence of pulmonary embolism. Normal heart size. No pericardial effusion. Mediastinum/Nodes: No enlarged mediastinal, hilar, or axillary lymph nodes. Thyroid gland, trachea, and esophagus demonstrate no significant findings. Lungs/Pleura: Large pleural effusions bilaterally. Adjacent atelectatic-appearing consolidation in the posterior lung bases. Airways are patent and normal in caliber. Musculoskeletal: Prior chest wall surgery with two plates anterior to the lower ribs and posterior to the sternum and xiphoid. No acute bony abnormality. Review of the MIP images confirms the above findings. CT ABDOMEN and PELVIS FINDINGS Hepatobiliary: No focal liver abnormality is seen. No gallstones, gallbladder wall thickening, or biliary dilatation. Pancreas: Unremarkable. No pancreatic ductal dilatation or surrounding inflammatory changes. Spleen: Normal in size without focal abnormality. Adrenals/Urinary Tract: Adrenal glands are unremarkable. Kidneys are normal, without renal calculi, focal lesion, or hydronephrosis. Bladder is unremarkable. Stomach/Bowel: Stomach is within normal limits. Appendix appears normal. Large volume stool throughout the colon, without obstruction or  inflammation. No evidence of bowel wall thickening, distention, or inflammatory changes. Vascular/Lymphatic: No significant vascular findings are present. No enlarged abdominal or pelvic lymph nodes. Reproductive: Uterus and bilateral adnexa are unremarkable. Other: No acute inflammation.  No ascites. Musculoskeletal: No significant skeletal lesion. Review of the MIP images confirms the above findings. IMPRESSION: 1. Negative for acute pulmonary embolism. 2. Large pleural effusions bilaterally with adjacent atelectatic appearing posterior lung base opacities. 3. Large volume colonic stool without evidence of bowel obstruction, perforation or inflammation. Electronically Signed   By: Ellery Plunk M.D.   On: 05/21/2017 19:14   Ct Abdomen Pelvis W Contrast  Result Date: 05/21/2017 CLINICAL DATA:  Chest pain, abdomen pain and fever. EXAM: CT ANGIOGRAPHY CHEST CT ABDOMEN AND PELVIS WITH CONTRAST TECHNIQUE: Multidetector CT imaging of the chest was performed using the standard protocol during bolus administration of intravenous contrast. Multiplanar CT image reconstructions and MIPs were obtained to evaluate the vascular anatomy. Multidetector CT imaging of the abdomen and pelvis was performed using the standard protocol during bolus administration of intravenous contrast. CONTRAST:  100 mL Isovue 370 intravenous COMPARISON:  Radiograph 05/21/2017 FINDINGS: CTA CHEST FINDINGS Cardiovascular: Satisfactory opacification of the pulmonary arteries to the segmental  level. No evidence of pulmonary embolism. Normal heart size. No pericardial effusion. Mediastinum/Nodes: No enlarged mediastinal, hilar, or axillary lymph nodes. Thyroid gland, trachea, and esophagus demonstrate no significant findings. Lungs/Pleura: Large pleural effusions bilaterally. Adjacent atelectatic-appearing consolidation in the posterior lung bases. Airways are patent and normal in caliber. Musculoskeletal: Prior chest wall surgery with two plates  anterior to the lower ribs and posterior to the sternum and xiphoid. No acute bony abnormality. Review of the MIP images confirms the above findings. CT ABDOMEN and PELVIS FINDINGS Hepatobiliary: No focal liver abnormality is seen. No gallstones, gallbladder wall thickening, or biliary dilatation. Pancreas: Unremarkable. No pancreatic ductal dilatation or surrounding inflammatory changes. Spleen: Normal in size without focal abnormality. Adrenals/Urinary Tract: Adrenal glands are unremarkable. Kidneys are normal, without renal calculi, focal lesion, or hydronephrosis. Bladder is unremarkable. Stomach/Bowel: Stomach is within normal limits. Appendix appears normal. Large volume stool throughout the colon, without obstruction or inflammation. No evidence of bowel wall thickening, distention, or inflammatory changes. Vascular/Lymphatic: No significant vascular findings are present. No enlarged abdominal or pelvic lymph nodes. Reproductive: Uterus and bilateral adnexa are unremarkable. Other: No acute inflammation.  No ascites. Musculoskeletal: No significant skeletal lesion. Review of the MIP images confirms the above findings. IMPRESSION: 1. Negative for acute pulmonary embolism. 2. Large pleural effusions bilaterally with adjacent atelectatic appearing posterior lung base opacities. 3. Large volume colonic stool without evidence of bowel obstruction, perforation or inflammation. Electronically Signed   By: Ellery Plunk M.D.   On: 05/21/2017 19:14   Ir US Chest  Result Date: 05/23/2017 CLINICAL DATA:  History of pectus excavatum surgery in September. Patient has developed shortness of breath and pleural effusions. Patient underwent a left thoracentesis on 05/22/2017. Evaluate for a right thoracentesis. EXAM: CHEST ULTRASOUND COMPARISON:  Chest radiograph 05/22/2017 and CT 05/21/2017 FINDINGS: There is a very small amount of pleural fluid in the posterior lower chest bilaterally. Although the patient just had  a left thoracentesis yesterday, there is slightly more fluid on the left than the right. IMPRESSION: Very small amount of bilateral pleural fluid. Due to the small pleural fluid volume, thoracentesis was not performed. Electronically Signed   By: Richarda Overlie M.D.   On: 05/23/2017 16:50   Ir Thoracentesis Asp Pleural Space W/img Guide  Result Date: 05/22/2017 INDICATION: History of pectus excavatum, status post recent surgery for rod placements in her chest at the [facility] in [place] on September 20th. Patient admitted with new onset shortness of breath. Imaging revealed large bilateral pleural effusions. Request is made for thoracentesis. EXAM: ULTRASOUND GUIDED DIAGNOSTIC AND THERAPEUTIC THORACENTESIS MEDICATIONS: 1% Lidocaine COMPLICATIONS: None immediate. PROCEDURE: An ultrasound guided thoracentesis was thoroughly discussed with the patient and questions answered. The benefits, risks, alternatives and complications were also discussed. The patient understands and wishes to proceed with the procedure. Written consent was obtained. Ultrasound was performed to localize and mark an adequate pocket of fluid in the left chest. The area was then prepped and draped in the normal sterile fashion. 1% Lidocaine was used for local anesthesia. Under ultrasound guidance a Safe-T-Centesis catheter was introduced. Thoracentesis was performed. The catheter was removed and a dressing applied. FINDINGS: A total of approximately 1.4 L of serosanguineous fluid was removed. Samples were sent to the laboratory as requested by the clinical team. IMPRESSION: Successful ultrasound guided left thoracentesis yielding 1.4 L of pleural fluid. Read by: Barnetta Chapel, PA-C Electronically Signed   By: Jolaine Click M.D.   On: 05/22/2017 13:21  Subjective: Patient feeling well this morning.  Slept well.  Chest feeling better.  Breathing okay.  No further drainage from site.  No fever, chils overnight.  Discharge  Exam: Vitals:   05/23/17 1632 05/23/17 2022  BP: (!) 106/55 113/60  Pulse: 81 89  Resp:  16  Temp: 98.4 F (36.9 C) 98.4 F (36.9 C)  SpO2:  98%   Vitals:   05/23/17 1100 05/23/17 1313 05/23/17 1632 05/23/17 2022  BP: (!) 107/52 113/67 (!) 106/55 113/60  Pulse:  87 81 89  Resp:    16  Temp: 97.9 F (36.6 C)  98.4 F (36.9 C) 98.4 F (36.9 C)  TempSrc:   Oral Oral  SpO2: 98%   98%  Weight:      Height:        General: Pt is alert, awake, not in acute distress Skin: The chest tube site is clean and dry.  The skin is slightly open, there is shallow ulcer with no drainage, dry granulation tissue, no surrounding redness, swelling or tenderness. Respiratory: CTA bilaterally, no wheezing, no rhonchi Abdominal: Soft, NT, ND, bowel sounds + Extremities: no edema, no cyanosis    The results of significant diagnostics from this hospitalization (including imaging, microbiology, ancillary and laboratory) are listed below for reference.     Microbiology: Recent Results (from the past 240 hour(s))  Blood culture (routine x 2)     Status: None (Preliminary result)   Collection Time: 05/21/17  4:05 PM  Result Value Ref Range Status   Specimen Description BLOOD LEFT ANTECUBITAL  Final   Special Requests   Final    BOTTLES DRAWN AEROBIC AND ANAEROBIC Blood Culture adequate volume   Culture NO GROWTH 2 DAYS  Final   Report Status PENDING  Incomplete  Blood culture (routine x 2)     Status: None (Preliminary result)   Collection Time: 05/21/17  4:25 PM  Result Value Ref Range Status   Specimen Description BLOOD LEFT ANTECUBITAL  Final   Special Requests   Final    BOTTLES DRAWN AEROBIC ONLY Blood Culture adequate volume   Culture NO GROWTH 2 DAYS  Final   Report Status PENDING  Incomplete  MRSA PCR Screening     Status: None   Collection Time: 05/22/17  1:25 AM  Result Value Ref Range Status   MRSA by PCR NEGATIVE NEGATIVE Final    Comment:        The GeneXpert MRSA Assay  (FDA approved for NASAL specimens only), is one component of a comprehensive MRSA colonization surveillance program. It is not intended to diagnose MRSA infection nor to guide or monitor treatment for MRSA infections.   Culture, body fluid-bottle     Status: None (Preliminary result)   Collection Time: 05/22/17 12:36 PM  Result Value Ref Range Status   Specimen Description PLEURAL LEFT  Final   Special Requests NONE  Final   Culture NO GROWTH 1 DAY  Final   Report Status PENDING  Incomplete  Gram stain     Status: None   Collection Time: 05/22/17 12:36 PM  Result Value Ref Range Status   Specimen Description PLEURAL LEFT  Final   Special Requests NONE  Final   Gram Stain   Final    RARE WBC PRESENT,BOTH PMN AND MONONUCLEAR NO ORGANISMS SEEN    Report Status 05/22/2017 FINAL  Final     Labs: BNP (last 3 results) No results for input(s): BNP in the last 8760 hours. Basic Metabolic  Panel:  Recent Labs Lab 05/21/17 1620 05/21/17 1715 05/22/17 0414 05/23/17 0533  NA 137 138 137 138  K 4.5 4.5 3.5 4.6  CL 103 104 102 107  CO2 23  --  27 26  GLUCOSE 93 88 101* 109*  BUN 13 15 10 11   CREATININE 0.81 0.70 0.83 0.84  CALCIUM 9.0  --  8.2* 8.4*   Liver Function Tests:  Recent Labs Lab 05/21/17 1620 05/23/17 0533  AST 25 17  ALT 36 23  ALKPHOS 63 54  BILITOT <0.1* 0.3  PROT 6.6 5.5*  ALBUMIN 3.2* 2.6*   No results for input(s): LIPASE, AMYLASE in the last 168 hours. No results for input(s): AMMONIA in the last 168 hours. CBC:  Recent Labs Lab 05/21/17 1620 05/21/17 1715 05/22/17 0414 05/23/17 0533 05/24/17 0533  WBC 7.1  --  6.9 6.1 9.3  NEUTROABS 4.4  --   --   --   --   HGB 11.3* 11.2* 10.5* 10.6* 10.9*  HCT 35.8* 33.0* 33.2* 33.0* 33.1*  MCV 88.6  --  87.8 87.8 86.4  PLT 479*  --  449* 379 412*   Cardiac Enzymes: No results for input(s): CKTOTAL, CKMB, CKMBINDEX, TROPONINI in the last 168 hours. BNP: Invalid input(s): POCBNP CBG: No results  for input(s): GLUCAP in the last 168 hours. D-Dimer No results for input(s): DDIMER in the last 72 hours. Hgb A1c No results for input(s): HGBA1C in the last 72 hours. Lipid Profile No results for input(s): CHOL, HDL, LDLCALC, TRIG, CHOLHDL, LDLDIRECT in the last 72 hours. Thyroid function studies No results for input(s): TSH, T4TOTAL, T3FREE, THYROIDAB in the last 72 hours.  Invalid input(s): FREET3 Anemia work up No results for input(s): VITAMINB12, FOLATE, FERRITIN, TIBC, IRON, RETICCTPCT in the last 72 hours. Urinalysis    Component Value Date/Time   COLORURINE YELLOW 05/21/2017 1637   APPEARANCEUR CLEAR 05/21/2017 1637   APPEARANCEUR Clear 11/08/2011 2017   LABSPEC 1.015 05/21/2017 1637   LABSPEC 1.008 11/08/2011 2017   PHURINE 5.0 05/21/2017 1637   GLUCOSEU NEGATIVE 05/21/2017 1637   GLUCOSEU Negative 11/08/2011 2017   HGBUR NEGATIVE 05/21/2017 1637   BILIRUBINUR NEGATIVE 05/21/2017 1637   BILIRUBINUR - 03/30/2014 1356   BILIRUBINUR Negative 11/08/2011 2017   KETONESUR NEGATIVE 05/21/2017 1637   PROTEINUR NEGATIVE 05/21/2017 1637   UROBILINOGEN negative 03/30/2014 1356   NITRITE NEGATIVE 05/21/2017 1637   LEUKOCYTESUR NEGATIVE 05/21/2017 1637   LEUKOCYTESUR Negative 11/08/2011 2017   Sepsis Labs Invalid input(s): PROCALCITONIN,  WBC,  LACTICIDVEN Microbiology Recent Results (from the past 240 hour(s))  Blood culture (routine x 2)     Status: None (Preliminary result)   Collection Time: 05/21/17  4:05 PM  Result Value Ref Range Status   Specimen Description BLOOD LEFT ANTECUBITAL  Final   Special Requests   Final    BOTTLES DRAWN AEROBIC AND ANAEROBIC Blood Culture adequate volume   Culture NO GROWTH 2 DAYS  Final   Report Status PENDING  Incomplete  Blood culture (routine x 2)     Status: None (Preliminary result)   Collection Time: 05/21/17  4:25 PM  Result Value Ref Range Status   Specimen Description BLOOD LEFT ANTECUBITAL  Final   Special Requests   Final     BOTTLES DRAWN AEROBIC ONLY Blood Culture adequate volume   Culture NO GROWTH 2 DAYS  Final   Report Status PENDING  Incomplete  MRSA PCR Screening     Status: None   Collection Time: 05/22/17  1:25 AM  Result Value Ref Range Status   MRSA by PCR NEGATIVE NEGATIVE Final    Comment:        The GeneXpert MRSA Assay (FDA approved for NASAL specimens only), is one component of a comprehensive MRSA colonization surveillance program. It is not intended to diagnose MRSA infection nor to guide or monitor treatment for MRSA infections.   Culture, body fluid-bottle     Status: None (Preliminary result)   Collection Time: 05/22/17 12:36 PM  Result Value Ref Range Status   Specimen Description PLEURAL LEFT  Final   Special Requests NONE  Final   Culture NO GROWTH 1 DAY  Final   Report Status PENDING  Incomplete  Gram stain     Status: None   Collection Time: 05/22/17 12:36 PM  Result Value Ref Range Status   Specimen Description PLEURAL LEFT  Final   Special Requests NONE  Final   Gram Stain   Final    RARE WBC PRESENT,BOTH PMN AND MONONUCLEAR NO ORGANISMS SEEN    Report Status 05/22/2017 FINAL  Final     Time coordinating discharge: Over 30 minutes  SIGNED:   Alberteen Sam, MD  [facility] 05/24/2017, 9:31 AM   If 7PM-7AM, please contact night-coverage [email or web address] Password [email or web address]

## 2017-05-25 LAB — CHOLESTEROL, BODY FLUID: CHOL FL: 56 mg/dL

## 2017-05-26 LAB — CULTURE, BLOOD (ROUTINE X 2)
Culture: NO GROWTH
Culture: NO GROWTH
SPECIAL REQUESTS: ADEQUATE
Special Requests: ADEQUATE

## 2017-05-27 LAB — CULTURE, BODY FLUID-BOTTLE: CULTURE: NO GROWTH

## 2017-05-27 LAB — CULTURE, BODY FLUID W GRAM STAIN -BOTTLE

## 2017-06-10 NOTE — Progress Notes (Signed)
Opened in error

## 2018-01-19 IMAGING — CT CT ANGIO CHEST
4 of 11 series · 18 of 46 positions shown · IV contrast (APPLIED)
Comparison: Radiograph 05/21/2017

CLINICAL DATA: Chest pain, abdomen pain and fever.

EXAM:
CT ANGIOGRAPHY CHEST
CT ABDOMEN AND PELVIS WITH CONTRAST
TECHNIQUE: Multidetector CT imaging of the chest was performed using the
standard protocol during bolus administration of intravenous
contrast. Multiplanar CT image reconstructions and MIPs were
obtained to evaluate the vascular anatomy. Multidetector CT imaging
of the abdomen and pelvis was performed using the standard protocol
during bolus administration of intravenous contrast.
CONTRAST:  100 mL Isovue 370 intravenous

[Series 8: lung · axial · 0.64mm/px · z∈[-49,+75]mm · 3 of 125 slices shown]
[im 32/125  soft-tissue]
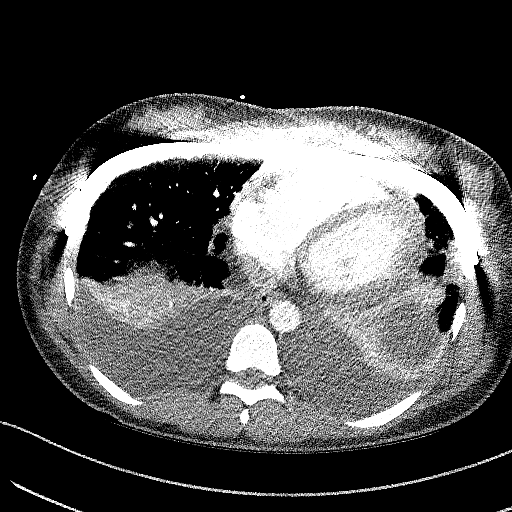
[im 63/125  soft-tissue]
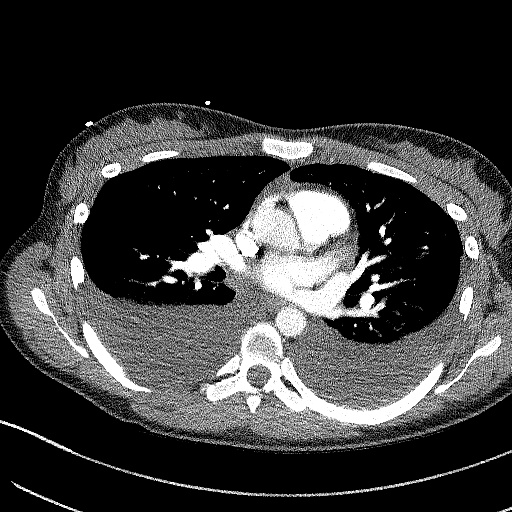
[im 94/125  soft-tissue]
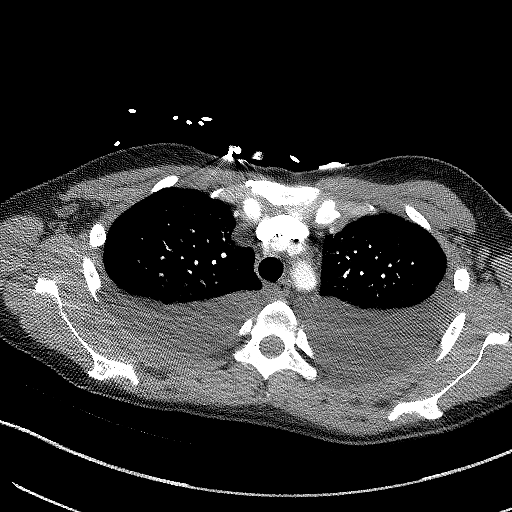

[Series 9: thins · axial · 0.64mm/px · z∈[-93,+117]mm · 12 of 357 slices shown]
[im 28/357  lung]
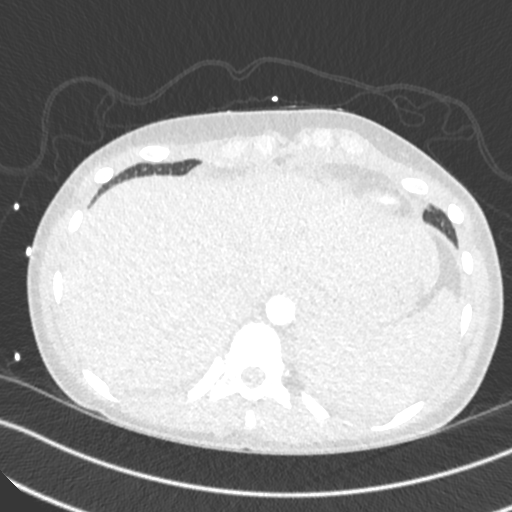
[im 55/357  soft-tissue]
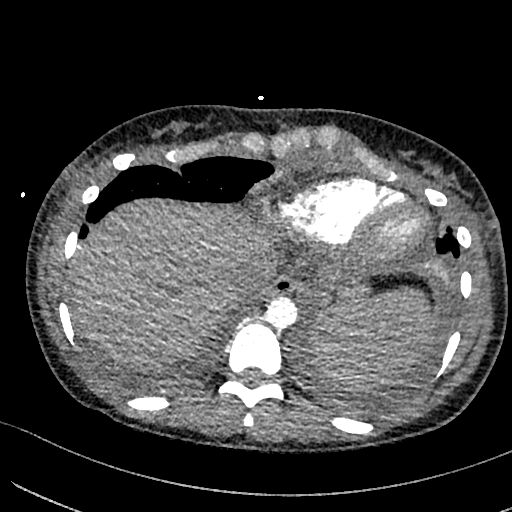
[im 83/357  lung]
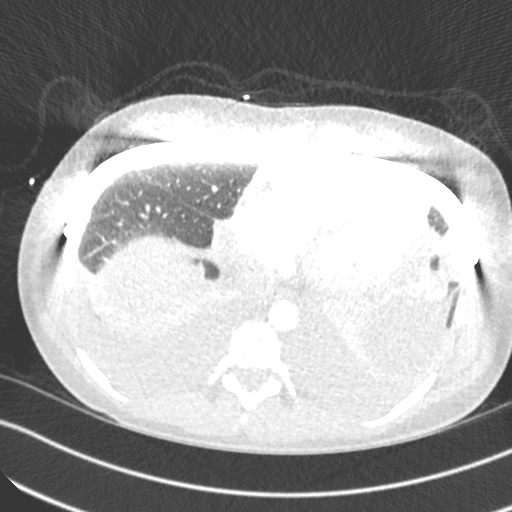
[im 110/357  soft-tissue]
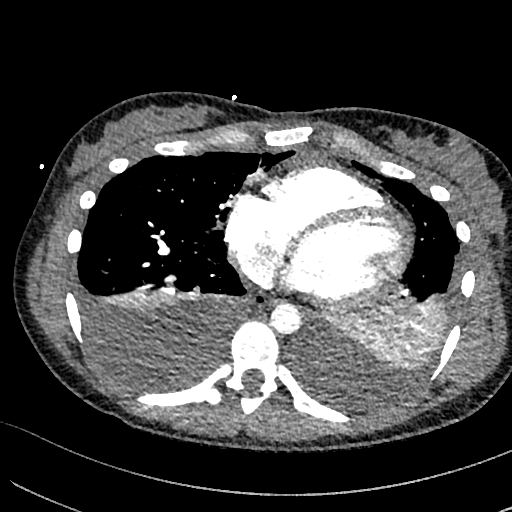
[im 137/357  lung]
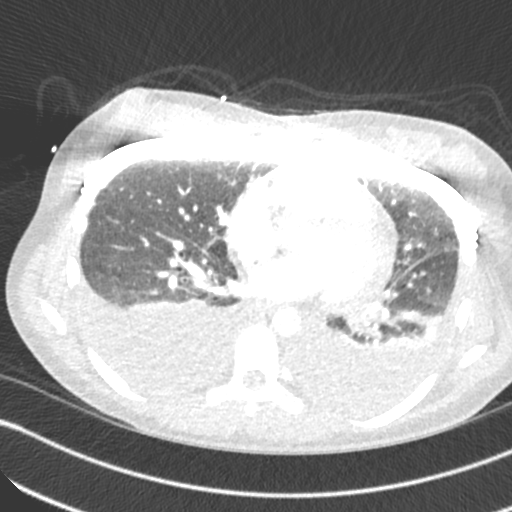
[im 165/357  soft-tissue]
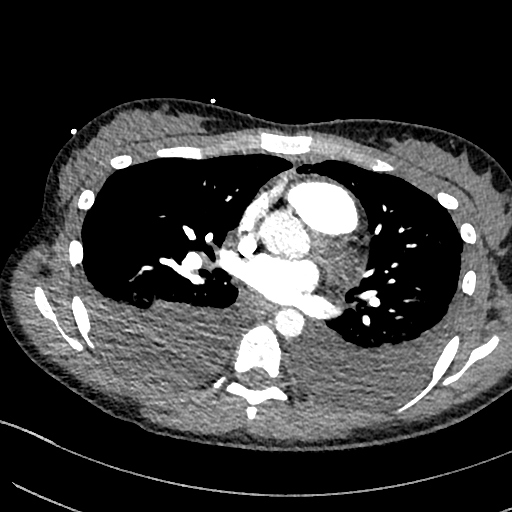
[im 192/357  lung]
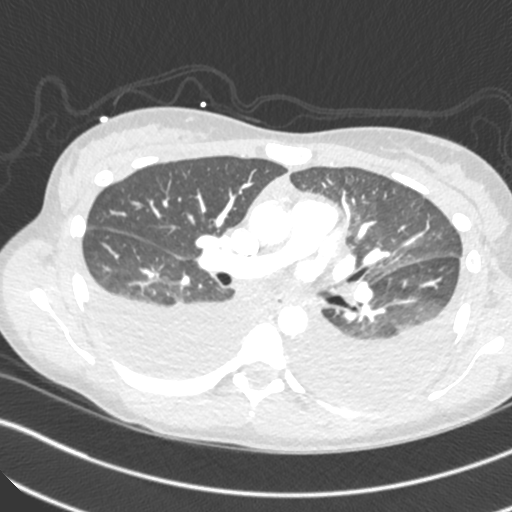
[im 220/357  soft-tissue]
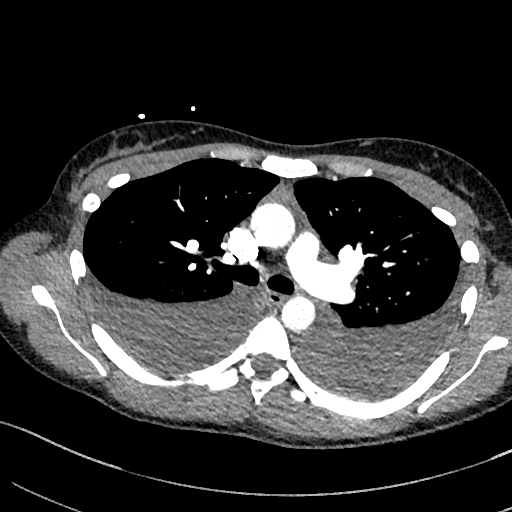
[im 247/357  lung]
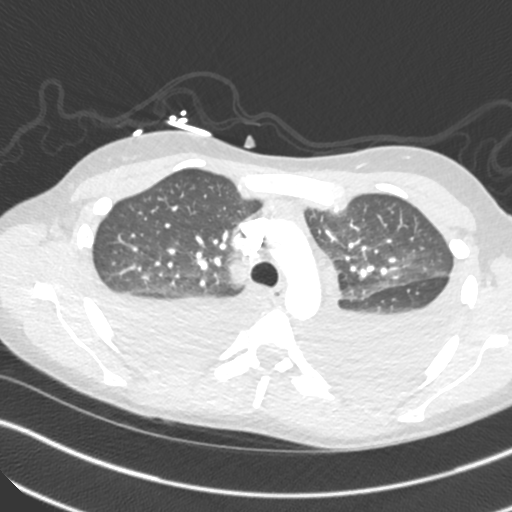
[im 274/357  soft-tissue]
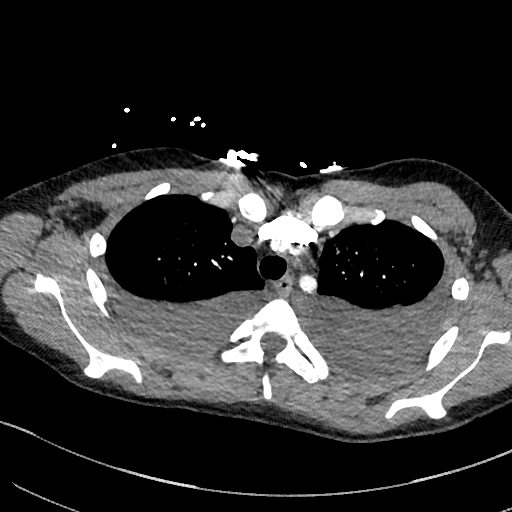
[im 302/357  lung]
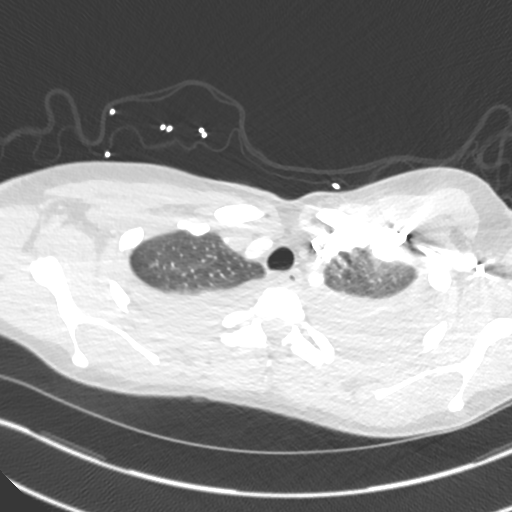
[im 329/357  soft-tissue]
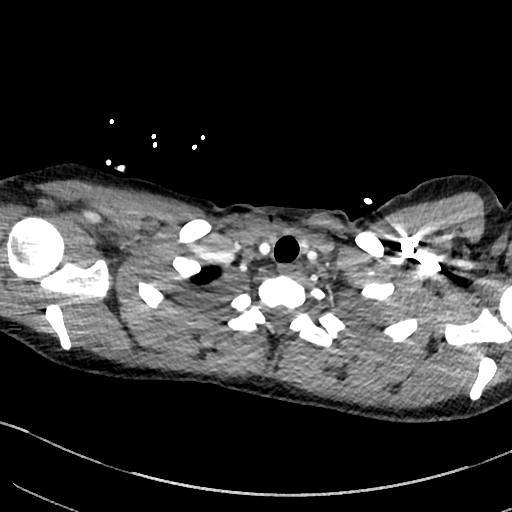

[Series 10: cor · coronal · 0.49mm/px · 1 of 108 slices shown]
[im 54/108  soft-tissue]
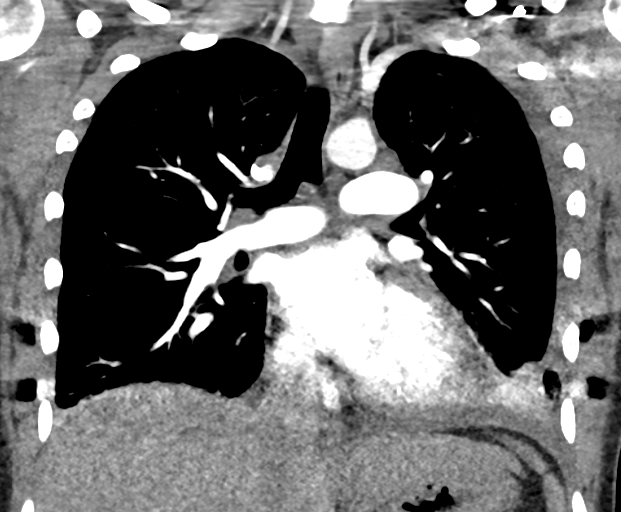

[Series 14: abdomen 5.0 · axial · 0.60mm/px · z∈[-371,-196]mm · 2 of 105 slices shown]
[im 35/105  lung]
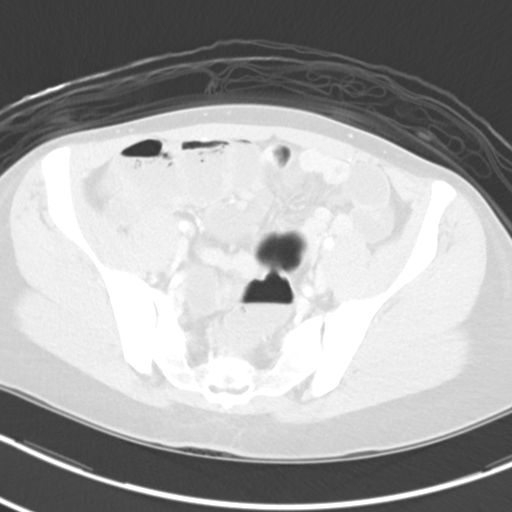
[im 70/105  lung]
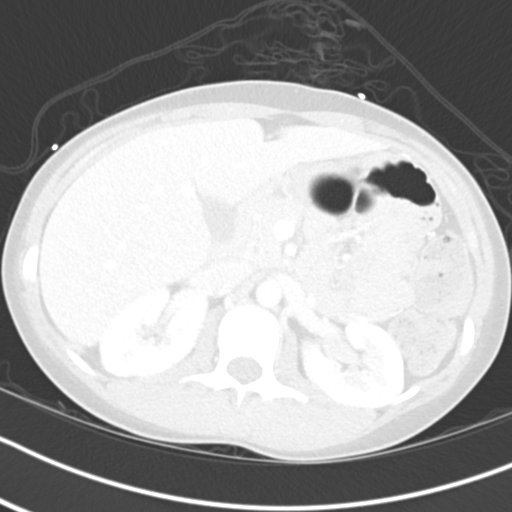

[18 of 46 positions shown; findings below may reference images not displayed]

FINDINGS: CTA CHEST FINDINGS

Cardiovascular: Satisfactory opacification of the pulmonary arteries
to the segmental level. No evidence of pulmonary embolism. Normal
heart size. No pericardial effusion.

Mediastinum/Nodes: No enlarged mediastinal, hilar, or axillary lymph
nodes. Thyroid gland, trachea, and esophagus demonstrate no
significant findings.

Lungs/Pleura: Large pleural effusions bilaterally. Adjacent
atelectatic-appearing consolidation in the posterior lung bases.
Airways are patent and normal in caliber.

Musculoskeletal: Prior chest wall surgery with two plates anterior
to the lower ribs and posterior to the sternum and xiphoid. No acute
bony abnormality.

Review of the MIP images confirms the above findings.

CT ABDOMEN and PELVIS FINDINGS

Hepatobiliary: No focal liver abnormality is seen. No gallstones,
gallbladder wall thickening, or biliary dilatation.

Pancreas: Unremarkable. No pancreatic ductal dilatation or
surrounding inflammatory changes.

Spleen: Normal in size without focal abnormality.

Adrenals/Urinary Tract: Adrenal glands are unremarkable. Kidneys are
normal, without renal calculi, focal lesion, or hydronephrosis.
Bladder is unremarkable.

Stomach/Bowel: Stomach is within normal limits. Appendix appears
normal. Large volume stool throughout the colon, without obstruction
or inflammation. No evidence of bowel wall thickening, distention,
or inflammatory changes.

Vascular/Lymphatic: No significant vascular findings are present. No
enlarged abdominal or pelvic lymph nodes.

Reproductive: Uterus and bilateral adnexa are unremarkable.

Other: No acute inflammation.  No ascites.

Musculoskeletal: No significant skeletal lesion.

Review of the MIP images confirms the above findings.
IMPRESSION: 1. Negative for acute pulmonary embolism.
2. Large pleural effusions bilaterally with adjacent atelectatic
appearing posterior lung base opacities.
3. Large volume colonic stool without evidence of bowel obstruction,
perforation or inflammation.
